# Patient Record
Sex: Female | Born: 1995 | Race: White | Hispanic: No | Smoking: Current some day smoker
Health system: Southern US, Community
[De-identification: ages and names within clinical notes are randomized; demographics above are authoritative.]

## PROBLEM LIST (undated history)

## (undated) DIAGNOSIS — F329 Major depressive disorder, single episode, unspecified: Secondary | ICD-10-CM

## (undated) DIAGNOSIS — F909 Attention-deficit hyperactivity disorder, unspecified type: Secondary | ICD-10-CM

## (undated) DIAGNOSIS — Z803 Family history of malignant neoplasm of breast: Secondary | ICD-10-CM

## (undated) DIAGNOSIS — F32A Depression, unspecified: Secondary | ICD-10-CM

## (undated) HISTORY — DX: Family history of malignant neoplasm of breast: Z80.3

## (undated) HISTORY — PX: WISDOM TOOTH EXTRACTION: SHX21

---
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
## 2014-09-28 ENCOUNTER — Encounter: Payer: Self-pay | Admitting: Emergency Medicine

## 2014-09-28 ENCOUNTER — Emergency Department
Admission: EM | Admit: 2014-09-28 | Discharge: 2014-09-28 | Disposition: A | Discharge status: Home / Self Care | Payer: BLUE CROSS/BLUE SHIELD | Attending: Emergency Medicine | Admitting: Emergency Medicine

## 2014-09-28 DIAGNOSIS — Z87891 Personal history of nicotine dependence: Secondary | ICD-10-CM | POA: Insufficient documentation

## 2014-09-28 DIAGNOSIS — F32A Depression, unspecified: Secondary | ICD-10-CM

## 2014-09-28 DIAGNOSIS — F329 Major depressive disorder, single episode, unspecified: Secondary | ICD-10-CM | POA: Insufficient documentation

## 2014-09-28 DIAGNOSIS — Z79899 Other long term (current) drug therapy: Secondary | ICD-10-CM | POA: Diagnosis not present

## 2014-09-28 DIAGNOSIS — F418 Other specified anxiety disorders: Secondary | ICD-10-CM

## 2014-09-28 DIAGNOSIS — Z3202 Encounter for pregnancy test, result negative: Secondary | ICD-10-CM | POA: Insufficient documentation

## 2014-09-28 DIAGNOSIS — F909 Attention-deficit hyperactivity disorder, unspecified type: Secondary | ICD-10-CM | POA: Diagnosis not present

## 2014-09-28 DIAGNOSIS — F430 Acute stress reaction: Secondary | ICD-10-CM | POA: Insufficient documentation

## 2014-09-28 DIAGNOSIS — F439 Reaction to severe stress, unspecified: Secondary | ICD-10-CM

## 2014-09-28 HISTORY — DX: Attention-deficit hyperactivity disorder, unspecified type: F90.9

## 2014-09-28 HISTORY — DX: Depression, unspecified: F32.A

## 2014-09-28 HISTORY — DX: Other specified anxiety disorders: F41.8

## 2014-09-28 HISTORY — DX: Major depressive disorder, single episode, unspecified: F32.9

## 2014-09-28 LAB — COMPREHENSIVE METABOLIC PANEL
ALT: 15 U/L (ref 14–54)
AST: 22 U/L (ref 15–41)
Albumin: 4.9 g/dL (ref 3.5–5.0)
Alkaline Phosphatase: 51 U/L (ref 38–126)
Anion gap: 6 (ref 5–15)
BUN: 15 mg/dL (ref 6–20)
CO2: 26 mmol/L (ref 22–32)
Calcium: 9 mg/dL (ref 8.9–10.3)
Chloride: 105 mmol/L (ref 101–111)
Creatinine, Ser: 0.98 mg/dL (ref 0.44–1.00)
GFR calc Af Amer: >60 mL/min (ref >60)
GFR calc non Af Amer: >60 mL/min (ref >60)
Glucose, Bld: 99 mg/dL (ref 65–99)
Potassium: 4.1 mmol/L (ref 3.5–5.1)
Sodium: 137 mmol/L (ref 135–145)
Total Bilirubin: 0.4 mg/dL (ref 0.3–1.2)
Total Protein: 7.5 g/dL (ref 6.5–8.1)

## 2014-09-28 LAB — POCT PREGNANCY, URINE: Preg Test, Ur: NEGATIVE

## 2014-09-28 LAB — URINE DRUG SCREEN, QUALITATIVE (ARMC ONLY)
Amphetamines, Ur Screen: NOT DETECTED — AB
Barbiturates, Ur Screen: NOT DETECTED — AB
Benzodiazepine, Ur Scrn: NOT DETECTED — AB
Cannabinoid 50 Ng, Ur ~~LOC~~: POSITIVE — AB
Cocaine Metabolite,Ur ~~LOC~~: POSITIVE — AB
MDMA (Ecstasy)Ur Screen: NOT DETECTED — AB
Methadone Scn, Ur: NOT DETECTED — AB
Opiate, Ur Screen: NOT DETECTED — AB
Phencyclidine (PCP) Ur S: NOT DETECTED — AB
Tricyclic, Ur Screen: NOT DETECTED — AB

## 2014-09-28 LAB — CBC
HCT: 40.8 % (ref 35.0–47.0)
Hemoglobin: 13.5 g/dL (ref 12.0–16.0)
MCH: 27.7 pg (ref 26.0–34.0)
MCHC: 33.2 g/dL (ref 32.0–36.0)
MCV: 83.4 fL (ref 80.0–100.0)
Platelets: 304 10*3/uL (ref 150–440)
RBC: 4.89 MIL/uL (ref 3.80–5.20)
RDW: 13.7 % (ref 11.5–14.5)
WBC: 11.4 10*3/uL — ABNORMAL HIGH (ref 3.6–11.0)

## 2014-09-28 LAB — SALICYLATE LEVEL: Salicylate Lvl: <4 mg/dL (ref 2.8–30.0)

## 2014-09-28 LAB — ACETAMINOPHEN LEVEL: Acetaminophen (Tylenol), Serum: <10 ug/mL — ABNORMAL LOW (ref 10–30)

## 2014-09-28 LAB — ETHANOL: Alcohol, Ethyl (B): <5 mg/dL (ref <5)

## 2014-09-28 MED ORDER — IBUPROFEN 600 MG PO TABS
ORAL_TABLET | ORAL | Status: AC
  Administered 2014-09-28: 600 mg via ORAL
  Filled 2014-09-28: qty 1

## 2014-09-28 MED ORDER — IBUPROFEN 600 MG PO TABS
600.0000 mg | ORAL_TABLET | Freq: Once | ORAL | Status: AC
  Administered 2014-09-28: 600 mg via ORAL

## 2014-09-28 NOTE — ED Notes (Signed)
BEHAVIORAL HEALTH ROUNDING Patient sleeping: No. Patient alert and oriented: yes Behavior appropriate: Yes.  ; If no, describe:  Nutrition and fluids offered: Yes  Toileting and hygiene offered: Yes  Sitter present: no Law enforcement present: Yes  

## 2014-09-28 NOTE — ED Notes (Signed)
States has had depression x 5 years, states was charged for ticket today and felt more depressed, states has thoughts of how she would hurt herself but not act on those thoughts

## 2014-09-28 NOTE — ED Notes (Signed)
Pt for discharge. Pt called for ride.

## 2014-09-28 NOTE — ED Notes (Signed)
Pregnancy test negative

## 2014-09-28 NOTE — Consult Note (Signed)
[facility] Consult   Reason for Consult:  Disposition Referring Physician:  ER Patient Identification: Debbie Coleman MRN:  [id number] Principal Diagnosis: Depression with anxiety Diagnosis:   Patient Active Problem List   Diagnosis Date Noted  . Depression with anxiety [F41.8] 09/28/2014    Total Time spent with patient: 45 minutes  Subjective:   Debbie Coleman is a 19 y.o. female patient admitted with anxiety and depression about recent speeding ticket and did not keep her appt for class to get out of the sam as she over slept. HPI:  Has been living with [place] parents and working as a Educational psychologist and has no money to pay for speeding ticket and started getting upset and banging her head and family got concerned and called for help. HPI Elements:   Pt is being followed by Dr,Su at [facility] and last apt was on 09/03/2014 and next apt is on 10/03/2014 and is being followed for ADHD and  Is on Vyvance 30 mgs po q am.  Past Medical History:  Past Medical History  Diagnosis Date  . Depression   . ADHD (attention deficit hyperactivity disorder)    History reviewed. No pertinent past surgical history. Family History: History reviewed. No pertinent family history. Social History:  History  Alcohol Use No     History  Drug Use Not on file    History   Social History  . Marital Status: Single    Spouse Name: N/A  . Number of Children: N/A  . Years of Education: N/A   Social History Main Topics  . Smoking status: Former Research scientist (life sciences)  . Smokeless tobacco: Not on file  . Alcohol Use: No  . Drug Use: Not on file  . Sexual Activity: Not on file   Other Topics Concern  . None   Social History Narrative  . None   Additional Social History:    Pain Medications: None reported Prescriptions: None reported Over the Counter: None reported History of alcohol / drug use?: Yes Longest period of sobriety (when/how long): Unknown Negative Consequences of Use:  Financial, Legal, Personal relationships, Work / School Withdrawal Symptoms:  (None reported) Name of Substance 1: THC 1 - Age of First Use: 16 1 - Amount (size/oz): 1 gram 1 - Frequency: 3 to 4 days week 1 - Duration: 3 years 1 - Last Use / Amount: 09/27/2014                   Allergies:  No Known Allergies  Labs:  Results for orders placed or performed during the hospital encounter of 09/28/14 (from the past 48 hour(s))  CBC     Status: Abnormal   Collection Time: 09/28/14 12:25 PM  Result Value Ref Range   WBC 11.4 (H) 3.6 - 11.0 K/uL   RBC 4.89 3.80 - 5.20 MIL/uL   Hemoglobin 13.5 12.0 - 16.0 g/dL   HCT 40.8 35.0 - 47.0 %   MCV 83.4 80.0 - 100.0 fL   MCH 27.7 26.0 - 34.0 pg   MCHC 33.2 32.0 - 36.0 g/dL   RDW 13.7 11.5 - 14.5 %   Platelets 304 150 - 440 K/uL  Comprehensive metabolic panel     Status: None   Collection Time: 09/28/14 12:25 PM  Result Value Ref Range   Sodium 137 135 - 145 mmol/L   Potassium 4.1 3.5 - 5.1 mmol/L   Chloride 105 101 - 111 mmol/L   CO2 26 22 - 32 mmol/L  Glucose, Bld 99 65 - 99 mg/dL   BUN 15 6 - 20 mg/dL   Creatinine, Ser 0.98 0.44 - 1.00 mg/dL   Calcium 9.0 8.9 - 10.3 mg/dL   Total Protein 7.5 6.5 - 8.1 g/dL   Albumin 4.9 3.5 - 5.0 g/dL   AST 22 15 - 41 U/L   ALT 15 14 - 54 U/L   Alkaline Phosphatase 51 38 - 126 U/L   Total Bilirubin 0.4 0.3 - 1.2 mg/dL   GFR calc non Af Amer >60 >60 mL/min   GFR calc Af Amer >60 >60 mL/min    Comment: (NOTE) The eGFR has been calculated using the CKD EPI equation. This calculation has not been validated in all clinical situations. eGFR's persistently <60 mL/min signify possible Chronic Kidney Disease.    Anion gap 6 5 - 15  Ethanol (ETOH)     Status: None   Collection Time: 09/28/14 12:25 PM  Result Value Ref Range   Alcohol, Ethyl (B) <5 <5 mg/dL    Comment:        LOWEST DETECTABLE LIMIT FOR SERUM ALCOHOL IS 11 mg/dL FOR MEDICAL PURPOSES ONLY   Acetaminophen level     Status:  Abnormal   Collection Time: 09/28/14 12:25 PM  Result Value Ref Range   Acetaminophen (Tylenol), Serum <10 (L) 10 - 30 ug/mL    Comment:        THERAPEUTIC CONCENTRATIONS VARY SIGNIFICANTLY. A RANGE OF 10-30 ug/mL MAY BE AN EFFECTIVE CONCENTRATION FOR MANY PATIENTS. HOWEVER, SOME ARE BEST TREATED AT CONCENTRATIONS OUTSIDE THIS RANGE. ACETAMINOPHEN CONCENTRATIONS >150 ug/mL AT 4 HOURS AFTER INGESTION AND >50 ug/mL AT 12 HOURS AFTER INGESTION ARE OFTEN ASSOCIATED WITH TOXIC REACTIONS.   Salicylate level     Status: None   Collection Time: 09/28/14 12:25 PM  Result Value Ref Range   Salicylate Lvl <3.9 2.8 - 30.0 mg/dL  Pregnancy, urine POC     Status: None   Collection Time: 09/28/14 12:57 PM  Result Value Ref Range   Preg Test, Ur NEGATIVE NEGATIVE    Comment:        THE SENSITIVITY OF THIS METHODOLOGY IS >24 mIU/mL     Vitals: Blood pressure 103/58, pulse 69, temperature 98.4 F (36.9 C), temperature source Oral, resp. rate 18, height '5\' 6"'  (1.676 m), weight 52.164 kg (115 lb), last menstrual period 09/28/2014, SpO2 100 %.  Risk to Self: Suicidal Ideation: No Suicidal Intent: No Is patient at risk for suicide?: No Suicidal Plan?: No Access to Means: No What has been your use of drugs/alcohol within the last 12 months?: THC use How many times?: 0 Other Self Harm Risks: None reported Triggers for Past Attempts: Other (Comment) (Stress) Intentional Self Injurious Behavior:  ("Banging her head against wall.") Risk to Others: Homicidal Ideation: No Thoughts of Harm to Others: No Current Homicidal Intent: No Current Homicidal Plan: No Access to Homicidal Means: No Identified Victim: None Reported History of harm to others?: No Assessment of Violence: None Noted Violent Behavior Description: None reported Does patient have access to weapons?: No Criminal Charges Pending?: No Does patient have a court date: No Prior Inpatient Therapy: Prior Inpatient Therapy:  No Prior Therapy Dates: None reported Prior Therapy Facilty/Provider(s): None reported Reason for Treatment: n/a Prior Outpatient Therapy: Prior Outpatient Therapy: Yes Prior Therapy Facilty/Provider(s): [facility] Reason for Treatment: ADHD Does patient have an ACCT team?: No Does patient have Intensive In-House Services?  : No Does patient have [facility] services? : No Does patient  have [facility] services?: No  No current facility-administered medications for this encounter.   Current Outpatient Prescriptions  Medication Sig Dispense Refill  . lisdexamfetamine (VYVANSE) 30 MG capsule Take 30 mg by mouth every other day.      Musculoskeletal: Strength & Muscle Tone: within normal limits Gait & Station: normal Patient leans: N/A  Psychiatric Specialty Exam: Physical Exam  Review of Systems  Constitutional: Negative.   HENT: Negative.   Eyes: Negative.   Respiratory: Negative.   Cardiovascular: Negative.   Gastrointestinal: Negative.   Genitourinary: Negative.   Skin: Negative.   Neurological: Negative.   Endo/Heme/Allergies: Negative.   Psychiatric/Behavioral: Negative.     Blood pressure 103/58, pulse 69, temperature 98.4 F (36.9 C), temperature source Oral, resp. rate 18, height '5\' 6"'  (1.676 m), weight 52.164 kg (115 lb), last menstrual period 09/28/2014, SpO2 100 %.Body mass index is 18.57 kg/(m^2).  General Appearance: Casual  Eye Contact::  Fair  Speech:  Clear and Coherent  Volume:  Normal  Mood:  Anxious  Affect:  Constricted  Thought Process:  Negative  Orientation:  Full (Time, Place, and Person)  Thought Content:  Negative  Suicidal Thoughts:  No  Homicidal Thoughts:  No  Memory:  Remote;   Good  Judgement:  Good  Insight:  Good  Psychomotor Activity:  Negative  Concentration:  Fair  Recall:  Good  Fund of Knowledge:Fair  Language: Fair  Akathisia:  No  Handed:  Right  AIMS (if indicated):     Assets:  Communication Skills Desire for  Improvement Housing Social Support  ADL's:  Intact  Cognition: WNL  Sleep:      Medical Decision Making: Review of Psycho-Social Stressors (1)  Treatment Plan Summary: Plan D/C IVC and discharge pt home to get help with Speeding ticket and keep follow up apt at [facility] for her worry and depression  about the same  Plan:  No evidence of imminent risk to self or others at present.   Patient does not meet criteria for psychiatric inpatient admission. Supportive therapy provided about ongoing stressors. D/C IVC and pt will go home and get help for her problems Disposition: As above  Dewain Penning 09/28/2014 6:33 PM

## 2014-09-28 NOTE — ED Notes (Signed)

## 2014-09-28 NOTE — ED Provider Notes (Signed)
Patient is cleared for discharge by Dr. Dorene Grebehawla. IVC is rescinded.  Patient given instructions by myself and psychiatry to follow up with her provider at [facility] as well as return precautions. Patient is currently stable. Vital signs normal. Awake and fully alert.  Sharyn CreamerMark Otto Caraway, MD 09/28/14 [phone]

## 2014-09-28 NOTE — ED Notes (Signed)
BEHAVIORAL HEALTH ROUNDING  Patient sleeping: No.  Patient alert and oriented: yes  Behavior appropriate: Yes. ; If no, describe:  Nutrition and fluids offered: Yes  Toileting and hygiene offered: Yes  Sitter present: not applicable  Law enforcement present: Yes ODS  

## 2014-09-28 NOTE — Discharge Instructions (Signed)
Depression  Follow-up with [facility] within the next 3-4 days. Return to the ER right away should you feel her risk of injury yourself, others, he developed hallucinations, failure symptoms are worsening, or other new concerns arise.  Depression refers to feeling sad, low, down in the dumps, blue, gloomy, or empty. In general, there are two kinds of depression: 1. Normal sadness or normal grief. This kind of depression is one that we all feel from time to time after upsetting life experiences, such as the loss of a job or the ending of a relationship. This kind of depression is considered normal, is short lived, and resolves within a few days to 2 weeks. Depression experienced after the loss of a loved one (bereavement) often lasts longer than 2 weeks but normally gets better with time. 2. Clinical depression. This kind of depression lasts longer than normal sadness or normal grief or interferes with your ability to function at home, at work, and in school. It also interferes with your personal relationships. It affects almost every aspect of your life. Clinical depression is an illness. Symptoms of depression can also be caused by conditions other than those mentioned above, such as:  Physical illness. Some physical illnesses, including underactive thyroid gland (hypothyroidism), severe anemia, specific types of cancer, diabetes, uncontrolled seizures, heart and lung problems, strokes, and chronic pain are commonly associated with symptoms of depression.  Side effects of some prescription medicine. In some people, certain types of medicine can cause symptoms of depression.  Substance abuse. Abuse of alcohol and illicit drugs can cause symptoms of depression. SYMPTOMS Symptoms of normal sadness and normal grief include the following:  Feeling sad or crying for short periods of time.  Not caring about anything (apathy).  Difficulty sleeping or sleeping too much.  No longer able  to enjoy the things you used to enjoy.  Desire to be by oneself all the time (social isolation).  Lack of energy or motivation.  Difficulty concentrating or remembering.  Change in appetite or weight.  Restlessness or agitation. Symptoms of clinical depression include the same symptoms of normal sadness or normal grief and also the following symptoms:  Feeling sad or crying all the time.  Feelings of guilt or worthlessness.  Feelings of hopelessness or helplessness.  Thoughts of suicide or the desire to harm yourself (suicidal ideation).  Loss of touch with reality (psychotic symptoms). Seeing or hearing things that are not real (hallucinations) or having false beliefs about your life or the people around you (delusions and paranoia). DIAGNOSIS  The diagnosis of clinical depression is usually based on how bad the symptoms are and how long they have lasted. Your health care provider will also ask you questions about your medical history and substance use to find out if physical illness, use of prescription medicine, or substance abuse is causing your depression. Your health care provider may also order blood tests. TREATMENT  Often, normal sadness and normal grief do not require treatment. However, sometimes antidepressant medicine is given for bereavement to ease the depressive symptoms until they resolve. The treatment for clinical depression depends on how bad the symptoms are but often includes antidepressant medicine, counseling with a mental health professional, or both. Your health care provider will help to determine what treatment is best for you. Depression caused by physical illness usually goes away with appropriate medical treatment of the illness. If prescription medicine is causing depression, talk with your health care provider about stopping the medicine, decreasing the dose,  or changing to another medicine. Depression caused by the abuse of alcohol or illicit drugs goes  away when you stop using these substances. Some adults need professional help in order to stop drinking or using drugs. SEEK IMMEDIATE MEDICAL CARE IF:  You have thoughts about hurting yourself or others.  You lose touch with reality (have psychotic symptoms).  You are taking medicine for depression and have a serious side effect. FOR MORE INFORMATION  National Alliance on Mental Illness: www.nami.[employer]  National Institute of Mental Health: http://www.maynard.net/www.nimh.nih.gov Document Released: 04/30/2000 Document Revised: 09/17/2013 Document Reviewed: 08/02/2011 [facility] Patient Information 2015 [place], [place]. This information is not intended to replace advice given to you by your health care provider. Make sure you discuss any questions you have with your health care provider.

## 2014-09-28 NOTE — BH Assessment (Signed)
Assessment Note  Debbie Coleman is an 19 y.o. female Who presents to the ER due to increase symptoms of depression and ADHD. Pt. states, several things have taken place and today she feels like they have overwhelmed. She was supposed to go to a driving class this morning, that would help her with a recent speeding ticket.  However, she overslept and she was unable to attend. According to the pt., she had to get the money from her grandmother to pay for the class and is unable to pay her back. She further reports, her car payment is due and she doesn't have enough money to pay that. Thus, when she gets overwhelmed she began to have an increase of racing thoughts and majority of them are negative. She states, she has thoughts of hurting herself but will not follow through with them. She explains, they are thoughts of stabbing herself in the chest with a knife to get rid of the pain. They are thoughts that "flow in an out. It's not like I sit and think about. I be like, why the fuck would I do that."  She reports she has never acted on the thoughts. She has a history of cutting. This is when she was in middle school. Since then she hasn't cut. She currently "bangs her head on the wall." This is done when she gets overwhelmed or stressed out. Today is the last time this has happened.  Pt. states, she doesn't do it on a regular basis nor does she does it to the point of hurting herself.  She is currently being followed by Psych MD, Dr. Fannie Knee with [facility]. She was last seen by him 09/03/2014 and she sees him on a monthly basis.  Pt. denies AV/H. She also denies past Suicidal/Homicidal Ideations, Gestures or attempts.  Axis I: ADHD, hyperactive type and Anxiety Disorder NOS Axis III:  Past Medical History  Diagnosis Date  . Depression   . ADHD (attention deficit hyperactivity disorder)    Axis IV: economic problems, occupational problems, other psychosocial or environmental problems, problems related to  legal system/crime, problems related to social environment and problems with primary support group  Past Medical History:  Past Medical History  Diagnosis Date  . Depression   . ADHD (attention deficit hyperactivity disorder)     No past surgical history on file.  Family History: No family history on file.  Social History:  reports that she has quit smoking. She does not have any smokeless tobacco history on file. She reports that she does not drink alcohol. Her drug history is not on file.  Additional Social History:  Alcohol / Drug Use Pain Medications: None reported Prescriptions: None reported Over the Counter: None reported History of alcohol / drug use?: Yes Longest period of sobriety (when/how long): Unknown Negative Consequences of Use: Financial, Legal, Personal relationships, Work / School Withdrawal Symptoms:  (None reported) Substance #1 Name of Substance 1: THC 1 - Age of First Use: 16 1 - Amount (size/oz): 1 gram 1 - Frequency: 3 to 4 days week 1 - Duration: 3 years 1 - Last Use / Amount: 09/27/2014  CIWA: CIWA-Ar BP: (!) 103/58 mmHg Pulse Rate: 69 COWS:    Allergies: No Known Allergies  Home Medications:  (Not in a hospital admission)  OB/GYN Status:  Patient's last menstrual period was 09/28/2014.  General Assessment Data Location of Assessment: [facility] ED Is this a Tele or Face-to-Face Assessment?: Face-to-Face Is this an Initial Assessment or a Re-assessment for  this encounter?: Initial Assessment Marital status: Single Is patient pregnant?: No Pregnancy Status: No Living Arrangements: Other relatives (Grandmother) Can pt return to current living arrangement?: Yes Admission Status: Voluntary Is patient capable of signing voluntary admission?: Yes Referral Source: Self/Family/Friend Insurance type: None  Medical Screening Exam [facility] Walk-in ONLY) Medical Exam completed: Yes  Crisis Care Plan Living Arrangements: Other relatives  Database administrator(Grandmother) Name of Psychiatrist: Dr. Janeece RiggersSu  Education Status Is patient currently in school?: No Highest grade of school patient has completed: 12  Risk to self with the past 6 months Suicidal Ideation: No Has patient been a risk to self within the past 6 months prior to admission? : No Suicidal Intent: No Has patient had any suicidal intent within the past 6 months prior to admission? : No Is patient at risk for suicide?: No Suicidal Plan?: No Has patient had any suicidal plan within the past 6 months prior to admission? : No Access to Means: No What has been your use of drugs/alcohol within the last 12 months?: THC use Previous Attempts/Gestures: No How many times?: 0 Other Self Harm Risks: None reported Triggers for Past Attempts: Other (Comment) (Stress) Intentional Self Injurious Behavior:  ("Banging her head against wall.") Family Suicide History: No Recent stressful life event(s): Conflict (Comment), Financial Problems, Legal Issues Persecutory voices/beliefs?: No Depression: Yes Depression Symptoms: Guilt, Feeling angry/irritable, Feeling worthless/self pity Substance abuse history and/or treatment for substance abuse?: Yes Suicide prevention information given to non-admitted patients: Not applicable  Risk to Others within the past 6 months Homicidal Ideation: No Does patient have any lifetime risk of violence toward others beyond the six months prior to admission? : No Thoughts of Harm to Others: No Current Homicidal Intent: No Current Homicidal Plan: No Access to Homicidal Means: No Identified Victim: None Reported History of harm to others?: No Assessment of Violence: None Noted Violent Behavior Description: None reported Does patient have access to weapons?: No Criminal Charges Pending?: No Does patient have a court date: No Is patient on probation?: No  Psychosis Hallucinations: None noted Delusions: None noted  Mental Status Report Appearance/Hygiene:  Unremarkable, In hospital gown Eye Contact: Good Motor Activity: Unremarkable, Freedom of movement Speech: Logical/coherent Level of Consciousness: Alert Mood: Depressed, Anxious, Sad Affect: Sad, Depressed, Appropriate to circumstance Anxiety Level: Minimal Thought Processes: Coherent, Relevant Judgement: Impaired Orientation: Person, Place, Time, Situation, Appropriate for developmental age Obsessive Compulsive Thoughts/Behaviors: Minimal  Cognitive Functioning Concentration: Decreased Memory: Remote Intact, Recent Intact IQ: Average Insight: Fair Impulse Control: Fair Appetite: Fair Weight Loss: 0 Weight Gain: 0 Sleep: Decreased Total Hours of Sleep: 5 Vegetative Symptoms: None  ADLScreening [facility] Assessment Services) Patient's cognitive ability adequate to safely complete daily activities?: Yes Patient able to express need for assistance with ADLs?: Yes Independently performs ADLs?: Yes (appropriate for developmental age)  Prior Inpatient Therapy Prior Inpatient Therapy: No Prior Therapy Dates: None reported Prior Therapy Facilty/Provider(s): None reported Reason for Treatment: n/a  Prior Outpatient Therapy Prior Outpatient Therapy: Yes Prior Therapy Facilty/Provider(s): [facility] Reason for Treatment: ADHD Does patient have an ACCT team?: No Does patient have Intensive In-House Services?  : No Does patient have [facility] services? : No Does patient have P4CC services?: No  ADL Screening (condition at time of admission) Patient's cognitive ability adequate to safely complete daily activities?: Yes Patient able to express need for assistance with ADLs?: Yes Independently performs ADLs?: Yes (appropriate for developmental age)       Abuse/Neglect Assessment (Assessment to be complete while patient is alone) Physical  Abuse: Denies Verbal Abuse: Denies Sexual Abuse: Denies Exploitation of patient/patient's resources: Denies Self-Neglect: Denies Values /  Beliefs Cultural Requests During Hospitalization: None Spiritual Requests During Hospitalization: None Consults Spiritual Care Consult Needed: No Social Work Consult Needed: No Merchant navy officerAdvance Directives (For Healthcare) Does patient have an advance directive?: No Would patient like information on creating an advanced directive?: No - patient declined information    Additional Information 1:1 In Past 12 Months?: No CIRT Risk: No Elopement Risk: No Does patient have medical clearance?: Yes  Child/Adolescent Assessment Running Away Risk: Admits (Pt. is an adult)  Disposition:  Disposition Initial Assessment Completed for this Encounter: Yes Disposition of Patient: Other dispositions (Psych MD to see) Other disposition(s): Other (Comment) (Psych MD to see)  On Site Evaluation by:   Reviewed with Physician:    Lilyan Gilfordalvin J. Giovanny Dugal, MS, LCAS, LPC, NCC, CCSI 09/28/2014 6:16 PM

## 2014-09-28 NOTE — ED Provider Notes (Signed)
[facility] Emergency Department Provider Note    ____________________________________________  Time seen: 1350  I have reviewed the triage vital signs and the nursing notes.   HISTORY  Chief Complaint Depression   History limited by: Not Limited   HPI Debbie Coleman is a 19 y.o. female who presents to the emergency department today because of concerns for depression. The patient states that she has felt depressed for the past roughly 5 years. She states she does have a psychiatrist but mainly talks to him about her ADHD. The patient states that her depression has been getting worse as she has been under stress. Today in particular she was under a lot of stress because she was missed her driving school which she was going to go 2 to get out of a speeding ticket. She states he overslept. She denies any current SI. States she has had leading thoughts wanting to harm herself in the past. States she would never actually harm herself.     Past Medical History  Diagnosis Date  . Depression   . ADHD (attention deficit hyperactivity disorder)     There are no active problems to display for this patient.   No past surgical history on file.  Current Outpatient Rx  Name  Route  Sig  Dispense  Refill  . lisdexamfetamine (VYVANSE) 30 MG capsule   Oral   Take 30 mg by mouth every other day.           Allergies Review of patient's allergies indicates no known allergies.  No family history on file.  Social History History  Substance Use Topics  . Smoking status: Former Games developermoker  . Smokeless tobacco: Not on file  . Alcohol Use: No    Review of Systems  Constitutional: Negative for fever. Cardiovascular: Negative for chest pain. Respiratory: Negative for shortness of breath. Gastrointestinal: Negative for abdominal pain, vomiting and diarrhea. Genitourinary: Negative for dysuria. Musculoskeletal: Negative for back pain. Skin: Negative for  rash. Neurological: Negative for headaches, focal weakness or numbness.   10-point ROS otherwise negative.  ____________________________________________   PHYSICAL EXAM:  VITAL SIGNS: ED Triage Vitals  Enc Vitals Group     BP 09/28/14 1236 103/58 mmHg     Pulse Rate 09/28/14 1236 69     Resp 09/28/14 1236 18     Temp 09/28/14 1236 98.4 F (36.9 C)     Temp Source 09/28/14 1236 Oral     SpO2 09/28/14 1236 100 %     Weight 09/28/14 1236 115 lb (52.164 kg)     Height 09/28/14 1236 5\' 6"  (1.676 m)     Head Cir --      Peak Flow --      Pain Score 09/28/14 1238 6     Pain Loc --      Pain Edu? --      Excl. in GC? --      Constitutional: Alert and oriented. Well appearing and in no distress. Eyes: Conjunctivae are normal. PERRL. Normal extraocular movements. ENT   Head: Normocephalic and atraumatic.   Nose: No congestion/rhinnorhea.   Mouth/Throat: Mucous membranes are moist.   Neck: No stridor. Hematological/Lymphatic/Immunilogical: No cervical lymphadenopathy. Cardiovascular: Normal rate, regular rhythm.  No murmurs, rubs, or gallops. Respiratory: Normal respiratory effort without tachypnea nor retractions. Breath sounds are clear and equal bilaterally. No wheezes/rales/rhonchi. Gastrointestinal: Soft and nontender. No distention.  Genitourinary: Deferred Musculoskeletal: Normal range of motion in all extremities. No joint effusions.  No lower  extremity tenderness nor edema. Neurologic:  Normal speech and language. No gross focal neurologic deficits are appreciated. Speech is normal.  Skin:  Skin is warm, dry and intact. No rash noted. Psychiatric: Mood and affect are normal. Speech and behavior are normal. Patient exhibits appropriate insight and judgment.  ____________________________________________    LABS (pertinent positives/negatives)  Awaiting ethanol, acetaminophen and salicylate at time of sign out. Otherwise blood work  unremarkable. ____________________________________________   EKG  None  ____________________________________________    RADIOLOGY  None  ____________________________________________   PROCEDURES  Procedure(s) performed: None  Critical Care performed: No  ____________________________________________   INITIAL IMPRESSION / ASSESSMENT AND PLAN / ED COURSE  Pertinent labs & imaging results that were available during my care of the patient were reviewed by me and considered in my medical decision making (see chart for details).  Patient here with acute worsening of depression. Denies any SI. No concerning findings on blood work. Will have behavioral health and psychiatry see patient.  ____________________________________________   FINAL CLINICAL IMPRESSION(S) / ED DIAGNOSES  Final diagnoses:  Depression  Stress     Phineas SemenGraydon Naijah Lacek, MD 09/28/14 1537

## 2014-10-22 ENCOUNTER — Other Ambulatory Visit: Payer: Self-pay | Admitting: Certified Nurse Midwife

## 2014-10-22 DIAGNOSIS — N63 Unspecified lump in unspecified breast: Secondary | ICD-10-CM

## 2014-10-24 ENCOUNTER — Ambulatory Visit
Admission: RE | Admit: 2014-10-24 | Discharge: 2014-10-24 | Disposition: A | Discharge status: Home / Self Care | Payer: BLUE CROSS/BLUE SHIELD | Source: Ambulatory Visit | Attending: Certified Nurse Midwife | Admitting: Certified Nurse Midwife

## 2014-10-24 DIAGNOSIS — N63 Unspecified lump in unspecified breast: Secondary | ICD-10-CM

## 2014-10-24 DIAGNOSIS — R928 Other abnormal and inconclusive findings on diagnostic imaging of breast: Secondary | ICD-10-CM | POA: Insufficient documentation

## 2016-03-02 DIAGNOSIS — M545 Low back pain, unspecified: Secondary | ICD-10-CM

## 2016-03-02 DIAGNOSIS — G8929 Other chronic pain: Secondary | ICD-10-CM | POA: Insufficient documentation

## 2016-03-02 HISTORY — DX: Low back pain, unspecified: M54.50

## 2016-08-02 ENCOUNTER — Ambulatory Visit: Payer: Self-pay

## 2016-08-04 ENCOUNTER — Ambulatory Visit (INDEPENDENT_AMBULATORY_CARE_PROVIDER_SITE_OTHER): Payer: BLUE CROSS/BLUE SHIELD

## 2016-08-04 ENCOUNTER — Ambulatory Visit: Payer: Self-pay

## 2016-08-04 DIAGNOSIS — Z308 Encounter for other contraceptive management: Secondary | ICD-10-CM | POA: Diagnosis not present

## 2016-08-04 LAB — POCT URINE PREGNANCY: Preg Test, Ur: NEGATIVE

## 2016-08-04 MED ORDER — MEDROXYPROGESTERONE ACETATE 150 MG/ML IM SUSP
150.0000 mg | Freq: Once | INTRAMUSCULAR | Status: AC
  Administered 2016-08-04: 150 mg via INTRAMUSCULAR

## 2016-10-27 ENCOUNTER — Ambulatory Visit: Payer: BLUE CROSS/BLUE SHIELD

## 2016-11-01 ENCOUNTER — Other Ambulatory Visit: Payer: Self-pay

## 2016-11-01 ENCOUNTER — Ambulatory Visit (INDEPENDENT_AMBULATORY_CARE_PROVIDER_SITE_OTHER): Payer: BLUE CROSS/BLUE SHIELD

## 2016-11-01 ENCOUNTER — Telehealth: Payer: Self-pay | Admitting: Obstetrics and Gynecology

## 2016-11-01 DIAGNOSIS — Z3042 Encounter for surveillance of injectable contraceptive: Secondary | ICD-10-CM

## 2016-11-01 MED ORDER — MEDROXYPROGESTERONE ACETATE 150 MG/ML IM SUSP
150.0000 mg | INTRAMUSCULAR | 0 refills | Status: DC
Start: 2016-11-01 — End: 2017-01-21

## 2016-11-01 MED ORDER — MEDROXYPROGESTERONE ACETATE 150 MG/ML IM SUSP
150.0000 mg | Freq: Once | INTRAMUSCULAR | Status: AC
  Administered 2016-11-01: 150 mg via INTRAMUSCULAR

## 2016-11-01 NOTE — Telephone Encounter (Signed)
Rx refill sent in, called pt no vmb set up, pt also needs to schedule annual

## 2016-11-01 NOTE — Telephone Encounter (Signed)
Pt missed her Depo shot appt that was scheduled for 10/27/16. Pt is scheduled for this afternoon at 4pm. Pt wanted to make sure her [facility] [address] [facility] [address] [facility] [address] so she can pick it up. Please advise. Thanks TNP

## 2016-11-01 NOTE — Telephone Encounter (Signed)
Pt returned call. Pt was advised as below and pt stated she would schedule her annual after her appt this afternoon. Thanks TNP

## 2017-01-21 ENCOUNTER — Other Ambulatory Visit: Payer: Self-pay | Admitting: Obstetrics & Gynecology

## 2017-01-21 DIAGNOSIS — Z3042 Encounter for surveillance of injectable contraceptive: Secondary | ICD-10-CM

## 2017-01-23 NOTE — Progress Notes (Deleted)
Gynecology Annual Exam  PCP: Kirk Ruths, MD  Chief Complaint: No chief complaint on file.   History of Present Illness:Debbie Coleman is a 21 year old Caucasian/White female , G 0 P 0 0 0 0 , who presents for her first annual gyn exam and Pap smear. . She is having no significant GYN problems.  Her menses are absent due to Depo provera. Her last dose was 10/24/2015.  She has had no spotting.   The patient's past medical history is notable for a history of ADD.   She is sexually active. She is currently using depo provera for contraception. Has had 3/3 Gardasil vaccinations.    There is a positive history of breast cancer in her maternal grandmother. Genetic testing has been done. The relative tested negative for BRCA 1 and negative for BRCA 2.  There is no family history of ovarian cancer.  The patient does do occasional self breast exams.  The patient does not smoke.  The patient does drink occasionally.  The patient does not use illegal drugs.  The patient does not exercise.  The patient may get adequate calcium in her diet.  She has not had a recent cholesterol screen and is not interested in labwork.    The patient {Blank single:19197::"reports","denies"} current symptoms of depression.    Review of Systems: Review of Systems  Constitutional: Negative for chills, fever and weight loss.  HENT: Negative for congestion, sinus pain and sore throat.   Eyes: Negative for blurred vision and pain.  Respiratory: Negative for hemoptysis, shortness of breath and wheezing.   Cardiovascular: Negative for chest pain, palpitations and leg swelling.  Gastrointestinal: Negative for abdominal pain, blood in stool, diarrhea, heartburn, nausea and vomiting.  Genitourinary: Negative for dysuria, frequency, hematuria and urgency.  Musculoskeletal: Negative for back pain, joint pain and myalgias.  Skin: Negative for itching and rash.  Neurological: Negative for dizziness,  tingling and headaches.  Endo/Heme/Allergies: Negative for environmental allergies and polydipsia. Does not bruise/bleed easily.       Negative for hirsutism   Psychiatric/Behavioral: Negative for depression. The patient is not nervous/anxious and does not have insomnia.     Past Medical History:  Past Medical History:  Diagnosis Date  . ADHD (attention deficit hyperactivity disorder)   . Depression     Past Surgical History:  No past surgical history on file.  Family History:  No family history on file.  Social History:  Social History   Social History  . Marital status: Single    Spouse name: N/A  . Number of children: N/A  . Years of education: N/A   Occupational History  . Not on file.   Social History Main Topics  . Smoking status: Former Research scientist (life sciences)  . Smokeless tobacco: Not on file  . Alcohol use No  . Drug use: Unknown  . Sexual activity: Not on file   Other Topics Concern  . Not on file   Social History Narrative  . No narrative on file    Allergies:  No Known Allergies  Medications: Prior to Admission medications   Medication Sig Start Date End Date Taking? Authorizing Provider  lisdexamfetamine (VYVANSE) 30 MG capsule Take 30 mg by mouth every other day.    [provider]  medroxyPROGESTERone (DEPO-PROVERA) 150 MG/ML injection INJECT 1 ML (150 MG TOTAL) INTO THE MUSCLE EVERY 3 (THREE) MONTHS. 01/21/17   Gae Dry, MD    Physical Exam Vitals: There were no vitals  taken for this visit.  General: NAD HEENT: normocephalic, anicteric Neck: no thyroid enlargement, no palpable nodules, no cervical lymphadenopathy  Pulmonary: No increased work of breathing, CTAB Cardiovascular: RRR, {Blank single:19197::"with murmur","without murmur"}  Breast: Breast symmetrical, no tenderness, no palpable nodules or masses, no skin or nipple retraction present, no nipple discharge.  No axillary, infraclavicular or supraclavicular lymphadenopathy. Abdomen:  Soft, non-tender, non-distended.  Umbilicus without lesions.  No hepatomegaly or masses palpable. No evidence of hernia. Genitourinary:  External: Normal external female genitalia.  Normal urethral meatus, normal  Bartholin's and Skene's glands.    Vagina: Normal vaginal mucosa, no evidence of prolapse.    Cervix: Grossly normal in appearance, no bleeding, non-tender  Uterus: Anteverted, normal size, shape, and consistency, mobile, and non-tender  Adnexa: No adnexal masses, non-tender  Rectal: deferred  Lymphatic: no evidence of inguinal lymphadenopathy Extremities: no edema, erythema, or tenderness Neurologic: Grossly intact Psychiatric: mood appropriate, affect full     Assessment: 21 y.o. No obstetric history on file. No problem-specific Assessment & Plan notes found for this encounter.   Plan:  ***  1) Breast cancer screening - recommend monthly self breast exam. {Blank single:19197::" ","Mammogram is up to date.","Mammogram was ordered today."}  2) STI screening was offered and {Blank single:19197::"accepted","declined"}.  3) Cervical cancer screening - {Blank single:19197::"Pap smear due in *** years","Pap not indicated","Pap was done"}. ASCCP guidelines and rational discussed.  Patient opts for {Blank single:19197::"every 5 years","every 3 years","yearly"} screening interval  4) Contraception - Education given regarding options for contraception  5) Routine healthcare maintenance including cholesterol and diabetes screening {Blank single:19197::"declined","managed by PCP","ordered today"}

## 2017-01-24 ENCOUNTER — Ambulatory Visit: Payer: BLUE CROSS/BLUE SHIELD | Admitting: Certified Nurse Midwife

## 2017-01-24 ENCOUNTER — Ambulatory Visit (INDEPENDENT_AMBULATORY_CARE_PROVIDER_SITE_OTHER): Payer: BLUE CROSS/BLUE SHIELD

## 2017-01-24 DIAGNOSIS — Z3042 Encounter for surveillance of injectable contraceptive: Secondary | ICD-10-CM | POA: Diagnosis not present

## 2017-01-24 MED ORDER — MEDROXYPROGESTERONE ACETATE 150 MG/ML IM SUSP
150.0000 mg | Freq: Once | INTRAMUSCULAR | Status: AC
  Administered 2017-01-24: 150 mg via INTRAMUSCULAR

## 2017-02-11 ENCOUNTER — Ambulatory Visit: Payer: BLUE CROSS/BLUE SHIELD | Admitting: Certified Nurse Midwife

## 2017-02-11 NOTE — Progress Notes (Deleted)
   Gynecology Annual Exam  PCP: Anderson, Marshall W, MD  Chief Complaint: No chief complaint on file.   History of Present Illness:Debbie Coleman is a 20 year old Caucasian/White female , G 0 P 0 0 0 0 , who presents for her first annual gyn exam and Pap smear. . She is having no significant GYN problems.  Her menses are absent due to Depo provera. Her last dose was 10/24/2015.  She has had no spotting.   The patient's past medical history is notable for a history of ADD.   She is sexually active. She is currently using depo provera for contraception. Has had 3/3 Gardasil vaccinations.    There is a positive history of breast cancer in her maternal grandmother. Genetic testing has been done. The relative tested negative for BRCA 1 and negative for BRCA 2.  There is no family history of ovarian cancer.  The patient does do occasional self breast exams.  The patient does not smoke.  The patient does drink occasionally.  The patient does not use illegal drugs.  The patient does not exercise.  The patient may get adequate calcium in her diet.  She has not had a recent cholesterol screen and is not interested in labwork.    The patient {Blank single:19197::"reports","denies"} current symptoms of depression.    Review of Systems: Review of Systems  Constitutional: Negative for chills, fever and weight loss.  HENT: Negative for congestion, sinus pain and sore throat.   Eyes: Negative for blurred vision and pain.  Respiratory: Negative for hemoptysis, shortness of breath and wheezing.   Cardiovascular: Negative for chest pain, palpitations and leg swelling.  Gastrointestinal: Negative for abdominal pain, blood in stool, diarrhea, heartburn, nausea and vomiting.  Genitourinary: Negative for dysuria, frequency, hematuria and urgency.  Musculoskeletal: Negative for back pain, joint pain and myalgias.  Skin: Negative for itching and rash.  Neurological: Negative for dizziness,  tingling and headaches.  Endo/Heme/Allergies: Negative for environmental allergies and polydipsia. Does not bruise/bleed easily.       Negative for hirsutism   Psychiatric/Behavioral: Negative for depression. The patient is not nervous/anxious and does not have insomnia.     Past Medical History:  Past Medical History:  Diagnosis Date  . ADHD (attention deficit hyperactivity disorder)   . Depression     Past Surgical History:  No past surgical history on file.  Family History:  No family history on file.  Social History:  Social History   Social History  . Marital status: Single    Spouse name: N/A  . Number of children: N/A  . Years of education: N/A   Occupational History  . Not on file.   Social History Main Topics  . Smoking status: Former Smoker  . Smokeless tobacco: Not on file  . Alcohol use No  . Drug use: Unknown  . Sexual activity: Not on file   Other Topics Concern  . Not on file   Social History Narrative  . No narrative on file    Allergies:  No Known Allergies  Medications: Prior to Admission medications   Medication Sig Start Date End Date Taking? Authorizing Provider  lisdexamfetamine (VYVANSE) 30 MG capsule Take 30 mg by mouth every other day.    [provider]  medroxyPROGESTERone (DEPO-PROVERA) 150 MG/ML injection INJECT 1 ML (150 MG TOTAL) INTO THE MUSCLE EVERY 3 (THREE) MONTHS. 01/21/17   Harris, Robert P, MD    Physical Exam Vitals: There were no vitals   taken for this visit.  General: NAD HEENT: normocephalic, anicteric Neck: no thyroid enlargement, no palpable nodules, no cervical lymphadenopathy  Pulmonary: No increased work of breathing, CTAB Cardiovascular: RRR, {Blank single:19197::"with murmur","without murmur"}  Breast: Breast symmetrical, no tenderness, no palpable nodules or masses, no skin or nipple retraction present, no nipple discharge.  No axillary, infraclavicular or supraclavicular lymphadenopathy. Abdomen:  Soft, non-tender, non-distended.  Umbilicus without lesions.  No hepatomegaly or masses palpable. No evidence of hernia. Genitourinary:  External: Normal external female genitalia.  Normal urethral meatus, normal  Bartholin's and Skene's glands.    Vagina: Normal vaginal mucosa, no evidence of prolapse.    Cervix: Grossly normal in appearance, no bleeding, non-tender  Uterus: Anteverted, normal size, shape, and consistency, mobile, and non-tender  Adnexa: No adnexal masses, non-tender  Rectal: deferred  Lymphatic: no evidence of inguinal lymphadenopathy Extremities: no edema, erythema, or tenderness Neurologic: Grossly intact Psychiatric: mood appropriate, affect full     Assessment: 21 y.o. No obstetric history on file. No problem-specific Assessment & Plan notes found for this encounter.   Plan:  ***  1) Breast cancer screening - recommend monthly self breast exam. {Blank single:19197::" ","Mammogram is up to date.","Mammogram was ordered today."}  2) STI screening was offered and {Blank single:19197::"accepted","declined"}.  3) Cervical cancer screening - {Blank single:19197::"Pap smear due in *** years","Pap not indicated","Pap was done"}. ASCCP guidelines and rational discussed.  Patient opts for {Blank single:19197::"every 5 years","every 3 years","yearly"} screening interval  4) Contraception - Education given regarding options for contraception  5) Routine healthcare maintenance including cholesterol and diabetes screening {Blank single:19197::"declined","managed by PCP","ordered today"}          

## 2017-04-15 ENCOUNTER — Other Ambulatory Visit: Payer: Self-pay | Admitting: Obstetrics & Gynecology

## 2017-04-15 DIAGNOSIS — Z3042 Encounter for surveillance of injectable contraceptive: Secondary | ICD-10-CM

## 2017-04-18 ENCOUNTER — Ambulatory Visit: Payer: BLUE CROSS/BLUE SHIELD

## 2017-04-28 ENCOUNTER — Ambulatory Visit: Payer: Self-pay

## 2017-05-05 ENCOUNTER — Ambulatory Visit (INDEPENDENT_AMBULATORY_CARE_PROVIDER_SITE_OTHER): Payer: Medicaid Other

## 2017-05-05 DIAGNOSIS — Z3042 Encounter for surveillance of injectable contraceptive: Secondary | ICD-10-CM | POA: Diagnosis not present

## 2017-05-05 DIAGNOSIS — Z308 Encounter for other contraceptive management: Secondary | ICD-10-CM

## 2017-05-05 DIAGNOSIS — Z309 Encounter for contraceptive management, unspecified: Secondary | ICD-10-CM | POA: Diagnosis not present

## 2017-05-05 LAB — POCT URINE PREGNANCY: Preg Test, Ur: NEGATIVE

## 2017-05-05 MED ORDER — MEDROXYPROGESTERONE ACETATE 150 MG/ML IM SUSP
150.0000 mg | Freq: Once | INTRAMUSCULAR | Status: AC
  Administered 2017-05-05: 150 mg via INTRAMUSCULAR

## 2017-05-05 NOTE — Progress Notes (Signed)
Pt here for depo which was given IM right glut after neg preg test.  NDC# [id number]

## 2017-07-28 ENCOUNTER — Ambulatory Visit (INDEPENDENT_AMBULATORY_CARE_PROVIDER_SITE_OTHER): Payer: Medicaid Other

## 2017-07-28 ENCOUNTER — Other Ambulatory Visit: Payer: Self-pay | Admitting: Certified Nurse Midwife

## 2017-07-28 ENCOUNTER — Ambulatory Visit: Payer: Medicaid Other

## 2017-07-28 ENCOUNTER — Other Ambulatory Visit: Payer: Self-pay | Admitting: Obstetrics & Gynecology

## 2017-07-28 DIAGNOSIS — Z3042 Encounter for surveillance of injectable contraceptive: Secondary | ICD-10-CM | POA: Diagnosis not present

## 2017-07-28 MED ORDER — MEDROXYPROGESTERONE ACETATE 150 MG/ML IM SUSP
150.0000 mg | Freq: Once | INTRAMUSCULAR | Status: AC
  Administered 2017-07-28: 150 mg via INTRAMUSCULAR

## 2017-07-28 NOTE — Telephone Encounter (Signed)
Pt is calling needing [facility] [place]

## 2017-07-28 NOTE — Telephone Encounter (Signed)
RX sent at 9:05 am by Dr. Tiburcio PeaHarris.

## 2017-08-15 DIAGNOSIS — Z803 Family history of malignant neoplasm of breast: Secondary | ICD-10-CM

## 2017-08-15 HISTORY — DX: Family history of malignant neoplasm of breast: Z80.3

## 2017-08-17 ENCOUNTER — Ambulatory Visit (INDEPENDENT_AMBULATORY_CARE_PROVIDER_SITE_OTHER): Payer: Medicaid Other | Admitting: Certified Nurse Midwife

## 2017-08-17 ENCOUNTER — Encounter: Payer: Self-pay | Admitting: Certified Nurse Midwife

## 2017-08-17 VITALS — BP 108/58 | HR 80 | Ht 65.0 in | Wt 126.0 lb

## 2017-08-17 DIAGNOSIS — Z309 Encounter for contraceptive management, unspecified: Secondary | ICD-10-CM

## 2017-08-17 DIAGNOSIS — Z113 Encounter for screening for infections with a predominantly sexual mode of transmission: Secondary | ICD-10-CM

## 2017-08-17 DIAGNOSIS — Z3042 Encounter for surveillance of injectable contraceptive: Secondary | ICD-10-CM

## 2017-08-17 DIAGNOSIS — Z124 Encounter for screening for malignant neoplasm of cervix: Secondary | ICD-10-CM

## 2017-08-17 DIAGNOSIS — Z01419 Encounter for gynecological examination (general) (routine) without abnormal findings: Secondary | ICD-10-CM

## 2017-08-17 MED ORDER — MEDROXYPROGESTERONE ACETATE 150 MG/ML IM SUSP: 150.0000 mg | INTRAMUSCULAR | 0 refills | Status: DC

## 2017-08-17 NOTE — Progress Notes (Signed)
Gynecology Annual Exam  PCP: Kirk Ruths, MD  Chief Complaint:  Chief Complaint  Patient presents with  . Gynecologic Exam    History of Present Illness:Debbie Coleman is a 22 year old Caucasian/White female , G 0 P 0 0 0 0 , who presents for her annual gyn exam . She is having no significant GYN problems.  Her menses are absent due to Depo provera. Her last dose was 07/28/2017 She has had no spotting.   The patient's past medical history is notable for a history of ADD.   She is sexually active. She is currently using depo provera for contraception. Has had 3/3 Gardasil vaccinations.    There is a positive history of breast cancer in her maternal grandmother. Genetic testing has been done. The relative tested negative for BRCA 1 and negative for BRCA 2.  There is no family history of ovarian cancer.  The patient does do occasional self breast exams.  The patient does not smoke.  The patient does drink 3-4 alcoholic beverages/week. The patient does not use illegal drugs.  The patient does occasional exercise.  The patient may get adequate calcium in her diet.  She has not had a recent cholesterol screen and is not interested in labwork.    Review of Systems: Review of Systems  Constitutional: Negative for chills, fever and weight loss.       Positive for weight gain  HENT: Negative for congestion, sinus pain and sore throat.   Eyes: Negative for blurred vision and pain.  Respiratory: Negative for hemoptysis, shortness of breath and wheezing.   Cardiovascular: Negative for chest pain, palpitations and leg swelling.  Gastrointestinal: Negative for abdominal pain, blood in stool, diarrhea, heartburn, nausea and vomiting.  Genitourinary: Negative for dysuria, frequency, hematuria and urgency.       Positive for amenorrhea  Musculoskeletal: Negative for back pain, joint pain and myalgias.  Skin: Negative for itching and rash.  Neurological: Negative for  dizziness, tingling and headaches.  Endo/Heme/Allergies: Negative for environmental allergies and polydipsia. Does not bruise/bleed easily.       Negative for hirsutism   Psychiatric/Behavioral: Negative for depression. The patient is not nervous/anxious and does not have insomnia.     Past Medical History:  Past Medical History:  Diagnosis Date  . ADHD (attention deficit hyperactivity disorder)   . Depression     Past Surgical History:  Past Surgical History:  Procedure Laterality Date  . FOOT SURGERY Left 09/2004   compound fracture of left foot-had open reduction and a second surgery to remove hardware.  . WISDOM TOOTH EXTRACTION  age 89    Family History:  Family History  Problem Relation Age of Onset  . Anxiety disorder Mother   . Other Maternal Grandmother        acquired absence of kidney  . Breast cancer Maternal Grandmother 86       BRCA negative not on tamoxifen after unknown estrogen  . Hypertension Maternal Grandmother   . Kidney failure Other     Social History:  Social History   Socioeconomic History  . Marital status: Single    Spouse name: Not on file  . Number of children: Not on file  . Years of education: Not on file  . Highest education level: Not on file  Occupational History  . Not on file  Social Needs  . Financial resource strain: Not on file  . Food insecurity:    Worry: Not  on file    Inability: Not on file  . Transportation needs:    Medical: Not on file    Non-medical: Not on file  Tobacco Use  . Smoking status: Former Research scientist (life sciences)  . Smokeless tobacco: Never Used  Substance and Sexual Activity  . Alcohol use: Yes    Comment: occasional  . Drug use: Never  . Sexual activity: Yes    Birth control/protection: Injection  Lifestyle  . Physical activity:    Days per week: 0 days    Minutes per session: 0 min  . Stress: To some extent  Relationships  . Social connections:    Talks on phone: Not on file    Gets together: Not on file     Attends religious service: Not on file    Active member of club or organization: Not on file    Attends meetings of clubs or organizations: Not on file    Relationship status: Not on file  . Intimate partner violence:    Fear of current or ex partner: Not on file    Emotionally abused: Not on file    Physically abused: Not on file    Forced sexual activity: Not on file  Other Topics Concern  . Not on file  Social History Narrative  . Not on file    Allergies:  No Known Allergies  Medications:  Current Outpatient Medications:  .  medroxyPROGESTERone (DEPO-PROVERA) 150 MG/ML injection, Inject 1 mL (150 mg total) into the muscle every 3 (three) months., Disp: 1 mL, Rfl: 0      Physical Exam Vitals: .BP (!) 108/58   Pulse 80   Ht '5\' 5"'  (1.651 m)   Wt 126 lb (57.2 kg)   BMI 20.97 kg/m   General: WF in NAD HEENT: normocephalic, anicteric Neck: no thyroid enlargement, no palpable nodules, no cervical lymphadenopathy  Pulmonary: No increased work of breathing, CTAB Cardiovascular: RRR, without murmur  Breast: Breast symmetrical, no tenderness, no palpable nodules or masses, no skin or nipple retraction present, no nipple discharge.  No axillary, infraclavicular or supraclavicular lymphadenopathy. Abdomen: Soft, non-tender, non-distended.  Umbilicus without lesions.  No hepatomegaly or masses palpable. No evidence of hernia. Genitourinary:  External: Normal external female genitalia.  Normal urethral meatus, normal  Bartholin's and Skene's glands.    Vagina: Normal vaginal mucosa, no evidence of prolapse.    Cervix: Grossly normal in appearance, no bleeding, non-tender  Uterus: Anteverted, deviated to left, normal size, shape, and consistency, mobile, and non-tender  Adnexa: No adnexal masses, non-tender  Rectal: deferred  Lymphatic: no evidence of inguinal lymphadenopathy Extremities: no edema, erythema, or tenderness Neurologic: Grossly intact Psychiatric: mood  appropriate, affect full     Assessment: 22 y.o. annual gyn exam  Plan:  1) Breast cancer screening - recommend monthly self breast exam.   2) STI screening was offered and accepted.  3) Cervical cancer screening -Paptima done  4) Contraception -No problems on the Depo. Would like to continue. RX: Depo Provera #1/RF x 4. Next dose due 10/20/2017  5) RTO 1 year for Pap smear/ annual exam  Dalia Heading, CNM

## 2017-08-19 LAB — PAP IG, CT-NG, RFX HPV ALL
Chlamydia, Nuc. Acid Amp: NEGATIVE
Gonococcus by Nucleic Acid Amp: NEGATIVE
PAP Smear Comment: 0

## 2017-09-13 ENCOUNTER — Encounter: Payer: Self-pay | Admitting: Obstetrics and Gynecology

## 2017-10-20 ENCOUNTER — Ambulatory Visit (INDEPENDENT_AMBULATORY_CARE_PROVIDER_SITE_OTHER): Payer: Self-pay

## 2017-10-20 DIAGNOSIS — Z308 Encounter for other contraceptive management: Secondary | ICD-10-CM

## 2017-10-20 MED ORDER — MEDROXYPROGESTERONE ACETATE 150 MG/ML IM SUSP
150.0000 mg | Freq: Once | INTRAMUSCULAR | Status: AC
Start: 2017-10-20 — End: 2017-10-20
  Administered 2017-10-20: 150 mg via INTRAMUSCULAR

## 2017-10-20 NOTE — Progress Notes (Signed)
Pt here for depo which was given IM right glut.  NDC# [id number] 

## 2018-01-12 ENCOUNTER — Ambulatory Visit: Payer: Medicaid Other

## 2018-01-12 ENCOUNTER — Ambulatory Visit (INDEPENDENT_AMBULATORY_CARE_PROVIDER_SITE_OTHER): Payer: Medicaid Other

## 2018-01-12 ENCOUNTER — Other Ambulatory Visit: Payer: Self-pay | Admitting: Obstetrics & Gynecology

## 2018-01-12 ENCOUNTER — Other Ambulatory Visit: Payer: Self-pay

## 2018-01-12 DIAGNOSIS — Z3042 Encounter for surveillance of injectable contraceptive: Secondary | ICD-10-CM

## 2018-01-12 DIAGNOSIS — Z3049 Encounter for surveillance of other contraceptives: Secondary | ICD-10-CM

## 2018-01-12 MED ORDER — MEDROXYPROGESTERONE ACETATE 150 MG/ML IM SUSP: 150.0000 mg | INTRAMUSCULAR | 0 refills | Status: DC

## 2018-01-12 MED ORDER — MEDROXYPROGESTERONE ACETATE 150 MG/ML IM SUSP
150.0000 mg | Freq: Once | INTRAMUSCULAR | Status: AC
  Administered 2018-01-12: 150 mg via INTRAMUSCULAR

## 2018-01-12 MED ORDER — MEDROXYPROGESTERONE ACETATE 150 MG/ML IM SUSP: 150.0000 mg | INTRAMUSCULAR | 1 refills | Status: DC

## 2018-01-12 NOTE — Telephone Encounter (Signed)
Pt was here for depo today. Inquired about how to get refills. Chart reviewed & noted that only 1 Depo was ordered w/0 refills at her 08/17/17 AE visit. She has had to call prior to her last 2 visits to get a refill called in. Advised will send in her last 2 refills to get her to her AE 08/2018.

## 2018-03-15 ENCOUNTER — Telehealth: Payer: Self-pay

## 2018-03-15 NOTE — Telephone Encounter (Signed)
Pt calling triage stating she has a uti, she does not have insurance. Pt wants to know what to do? Pt aware It $78.75 , she can not afford that with the medication.

## 2018-03-16 NOTE — Telephone Encounter (Signed)
Can she call her PCP? [facility]? It would probably be less expensive.

## 2018-03-16 NOTE — Telephone Encounter (Signed)
Pt aware.

## 2018-04-06 ENCOUNTER — Ambulatory Visit (INDEPENDENT_AMBULATORY_CARE_PROVIDER_SITE_OTHER): Payer: Medicaid Other

## 2018-04-06 ENCOUNTER — Ambulatory Visit: Payer: Medicaid Other

## 2018-04-06 DIAGNOSIS — Z3042 Encounter for surveillance of injectable contraceptive: Secondary | ICD-10-CM

## 2018-04-06 MED ORDER — MEDROXYPROGESTERONE ACETATE 150 MG/ML IM SUSP
150.0000 mg | Freq: Once | INTRAMUSCULAR | Status: AC
  Administered 2018-04-06: 150 mg via INTRAMUSCULAR

## 2018-05-22 ENCOUNTER — Encounter: Payer: Self-pay | Admitting: Emergency Medicine

## 2018-05-22 ENCOUNTER — Emergency Department
Admission: EM | Admit: 2018-05-22 | Discharge: 2018-05-22 | Disposition: A | Discharge status: Home / Self Care | Payer: BLUE CROSS/BLUE SHIELD | Attending: Emergency Medicine | Admitting: Emergency Medicine

## 2018-05-22 DIAGNOSIS — G8929 Other chronic pain: Secondary | ICD-10-CM | POA: Insufficient documentation

## 2018-05-22 DIAGNOSIS — M5440 Lumbago with sciatica, unspecified side: Secondary | ICD-10-CM | POA: Insufficient documentation

## 2018-05-22 DIAGNOSIS — Z87891 Personal history of nicotine dependence: Secondary | ICD-10-CM | POA: Insufficient documentation

## 2018-05-22 MED ORDER — TRAMADOL HCL 50 MG PO TABS: 50.0000 mg | ORAL_TABLET | Freq: Four times a day (QID) | ORAL | 0 refills | Status: DC | PRN

## 2018-05-22 MED ORDER — ORPHENADRINE CITRATE 30 MG/ML IJ SOLN
60.0000 mg | Freq: Two times a day (BID) | INTRAMUSCULAR | Status: DC
  Administered 2018-05-22: 60 mg via INTRAMUSCULAR
  Filled 2018-05-22: qty 2

## 2018-05-22 MED ORDER — HYDROMORPHONE HCL 1 MG/ML IJ SOLN
1.0000 mg | Freq: Once | INTRAMUSCULAR | Status: AC
  Administered 2018-05-22: 1 mg via INTRAMUSCULAR
  Filled 2018-05-22: qty 1

## 2018-05-22 MED ORDER — PREDNISONE 20 MG PO TABS
60.0000 mg | ORAL_TABLET | Freq: Once | ORAL | Status: AC
  Administered 2018-05-22: 60 mg via ORAL
  Filled 2018-05-22: qty 3

## 2018-05-22 MED ORDER — CYCLOBENZAPRINE HCL 10 MG PO TABS: 10.0000 mg | ORAL_TABLET | Freq: Three times a day (TID) | ORAL | 0 refills | Status: DC | PRN

## 2018-05-22 MED ORDER — METHYLPREDNISOLONE 4 MG PO TBPK: ORAL_TABLET | ORAL | 0 refills | Status: DC

## 2018-05-22 NOTE — ED Notes (Signed)
See triage note  Presents with lower back pain which radiates into both legs  States she has an injury several years ago and has pain most days  But pain has gotten worse

## 2018-05-22 NOTE — ED Provider Notes (Signed)
[facility] Emergency Department Provider Note   ____________________________________________   First MD Initiated Contact with Patient 05/22/18 1721     (approximate)  I have reviewed the triage vital signs and the nursing notes.   HISTORY  Chief Complaint Back Pain    HPI Debbie Coleman is a 23 y.o. female patient presents with acute non-provocative back pain upon awakening today.  Patient has a history of chronic back pain.  Patient states she had a back injury several years ago.  Patient's past MRI and x-rays were unremarkable.  Patient stated no bladder or bowel dysfunction.  Patient states there is a radicular component to the bilateral legs that extends only to the knees.  Rates the pain as a 10/10.  Described the pain as "sharp/spasmatic".  No palliative measure for complaint.  Past Medical History:  Diagnosis Date  . ADHD (attention deficit hyperactivity disorder)   . Depression   . Family history of breast cancer 08/2017   qualifies for updated genetic testing, may wait till older    Patient Active Problem List   Diagnosis Date Noted  . Depression with anxiety 09/28/2014    Past Surgical History:  Procedure Laterality Date  . FOOT SURGERY Left 09/2004   compound fracture of left foot-had open reduction and a second surgery to remove hardware.  . WISDOM TOOTH EXTRACTION  age 18    Prior to Admission medications   Medication Sig Start Date End Date Taking? Authorizing Provider  cyclobenzaprine (FLEXERIL) 10 MG tablet Take 1 tablet (10 mg total) by mouth 3 (three) times daily as needed. 05/22/18   Sable Feil, PA-C  medroxyPROGESTERone (DEPO-PROVERA) 150 MG/ML injection INJECT 1 ML (150 MG TOTAL) INTO THE MUSCLE EVERY 3 (THREE) MONTHS. 01/12/18   Rod Can, CNM  medroxyPROGESTERone (DEPO-PROVERA) 150 MG/ML injection Inject 1 mL (150 mg total) into the muscle every 3 (three) months. 01/12/18   Dalia Heading, CNM    methylPREDNISolone (MEDROL DOSEPAK) 4 MG TBPK tablet Take Tapered dose as directed 05/22/18   Sable Feil, PA-C  traMADol (ULTRAM) 50 MG tablet Take 1 tablet (50 mg total) by mouth every 6 (six) hours as needed. 05/22/18 05/22/19  Sable Feil, PA-C    Allergies Patient has no known allergies.  Family History  Problem Relation Age of Onset  . Anxiety disorder Mother   . Other Maternal Grandmother        acquired absence of kidney  . Breast cancer Maternal Grandmother 68       BRCA negative not on tamoxifen after unknown estrogen  . Hypertension Maternal Grandmother   . Kidney failure Other     Social History Social History   Tobacco Use  . Smoking status: Former Research scientist (life sciences)  . Smokeless tobacco: Never Used  Substance Use Topics  . Alcohol use: Yes    Comment: 3 x per week  . Drug use: Never    Review of Systems  Constitutional: No fever/chills Eyes: No visual changes. ENT: No sore throat. Cardiovascular: Denies chest pain. Respiratory: Denies shortness of breath. Gastrointestinal: No abdominal pain.  No nausea, no vomiting.  No diarrhea.  No constipation. Genitourinary: Negative for dysuria. Musculoskeletal: Negative for back pain. Skin: Negative for rash. Neurological: Negative for headaches, focal weakness or numbness. Psychiatric:ADHD and depression. ____________________________________________   PHYSICAL EXAM:  VITAL SIGNS: ED Triage Vitals  Enc Vitals Group     BP 05/22/18 1703 117/80     Pulse Rate 05/22/18 1703 75  Resp 05/22/18 1703 16     Temp 05/22/18 1703 98.1 F (36.7 C)     Temp Source 05/22/18 1703 Oral     SpO2 05/22/18 1703 98 %     Weight 05/22/18 1704 120 lb (54.4 kg)     Height 05/22/18 1704 _0  (1.651 m)     Head Circumference --      Peak Flow --      Pain Score 05/22/18 1703 10     Pain Loc --      Pain Edu? --      Excl. in [place]? --    Constitutional: Alert and oriented. Well appearing and in no acute distress. Neck:No  cervical spine tenderness to palpation. Hematological/Lymphatic/Immunilogical: No cervical lymphadenopathy. Cardiovascular: Normal rate, regular rhythm. Grossly normal heart sounds.  Good peripheral circulation. Respiratory: Normal respiratory effort.  No retractions. Lungs CTAB. Gastrointestinal: Soft and nontender. No distention. No abdominal bruits. No CVA tenderness. Genitourinary: Deferred Musculoskeletal: No obvious spinal deformity.  Decreased range of motion is all fields.  Patient is negative straight leg test in the supine position.  No lower extremity tenderness nor edema.  No joint effusions. Neurologic:  Normal speech and language. No gross focal neurologic deficits are appreciated. No gait instability. Skin:  Skin is warm, dry and intact. No rash noted. Psychiatric: Mood and affect are normal. Speech and behavior are normal.  ____________________________________________   LABS (all labs ordered are listed, but only abnormal results are displayed)  Labs Reviewed - No data to display ____________________________________________  EKG   ____________________________________________  RADIOLOGY  ED MD interpretation:    Official radiology report(s): No results found.  ____________________________________________   PROCEDURES  Procedure(s) performed: None  Procedures  Critical Care performed: No  ____________________________________________   INITIAL IMPRESSION / ASSESSMENT AND PLAN / ED COURSE  As part of my medical decision making, I reviewed the following data within the electronic MEDICAL RECORD NUMBER    Acute radicular back pain.  Patient given discharge care instruction.  Patient advised follow-up PCP for continued care.  Take medication as directed.  Advised of the drowsy effect of these medications.  Patient given a work note.      ____________________________________________   FINAL CLINICAL IMPRESSION(S) / ED DIAGNOSES  Final diagnoses:    Chronic midline low back pain with sciatica, sciatica laterality unspecified     ED Discharge Orders         Ordered    methylPREDNISolone (MEDROL DOSEPAK) 4 MG TBPK tablet     05/22/18 1758    cyclobenzaprine (FLEXERIL) 10 MG tablet  3 times daily PRN     05/22/18 1758    traMADol (ULTRAM) 50 MG tablet  Every 6 hours PRN     05/22/18 1758           Note:  This document was prepared using Dragon voice recognition software and may include unintentional dictation errors.    Sable Feil, PA-C 05/22/18 1804    Schaevitz, Randall An, MD 05/22/18 2056

## 2018-05-22 NOTE — ED Triage Notes (Signed)
Pt via pov from home with acute on chronic back pain. She injured it several years ago and had some treatment but has not lately as she has no insurance. Pt noted to walk gingerly and states it is difficult to walk or bend. Pt alert & oriented with nad noted.

## 2018-06-29 ENCOUNTER — Ambulatory Visit: Payer: Medicaid Other

## 2018-07-06 ENCOUNTER — Ambulatory Visit: Payer: Medicaid Other

## 2018-07-06 ENCOUNTER — Ambulatory Visit (INDEPENDENT_AMBULATORY_CARE_PROVIDER_SITE_OTHER): Payer: Medicaid Other

## 2018-07-06 DIAGNOSIS — Z3042 Encounter for surveillance of injectable contraceptive: Secondary | ICD-10-CM

## 2018-07-06 MED ORDER — MEDROXYPROGESTERONE ACETATE 150 MG/ML IM SUSP
150.0000 mg | Freq: Once | INTRAMUSCULAR | Status: AC
  Administered 2018-07-06: 150 mg via INTRAMUSCULAR

## 2018-09-28 ENCOUNTER — Ambulatory Visit: Payer: Medicaid Other

## 2018-09-29 ENCOUNTER — Other Ambulatory Visit: Payer: Self-pay | Admitting: Advanced Practice Midwife

## 2018-09-29 ENCOUNTER — Ambulatory Visit: Payer: Medicaid Other

## 2018-09-29 DIAGNOSIS — Z3042 Encounter for surveillance of injectable contraceptive: Secondary | ICD-10-CM

## 2018-09-29 NOTE — Telephone Encounter (Signed)
Please advise 

## 2018-09-29 NOTE — Telephone Encounter (Signed)
Please send refill 

## 2018-10-02 ENCOUNTER — Ambulatory Visit: Payer: Medicaid Other

## 2018-10-05 ENCOUNTER — Other Ambulatory Visit: Payer: Self-pay

## 2018-10-05 ENCOUNTER — Ambulatory Visit (INDEPENDENT_AMBULATORY_CARE_PROVIDER_SITE_OTHER): Payer: Self-pay

## 2018-10-05 DIAGNOSIS — Z3042 Encounter for surveillance of injectable contraceptive: Secondary | ICD-10-CM

## 2018-10-05 MED ORDER — MEDROXYPROGESTERONE ACETATE 150 MG/ML IM SUSP
150.0000 mg | Freq: Once | INTRAMUSCULAR | Status: AC
  Administered 2018-10-05: 150 mg via INTRAMUSCULAR

## 2018-11-21 NOTE — Progress Notes (Addendum)
Gynecology Annual Exam  PCP: Kirk Ruths, MD  Chief Complaint:  Chief Complaint  Patient presents with  . Gynecologic Exam    History of Present Illness:Debbie Coleman is a 23 year old Caucasian/White female , G 0 P 0 0 0 0 , who presents for her annual gyn exam . She is having no significant GYN problems.  Her menses are absent due to Depo provera (did have one menses last year). Her last dose was 10/05/2018 She has had no spotting.   The patient's past medical history is notable for a history of ADD.   She is sexually active. She is currently using depo provera for contraception. Has had 3/3 Gardasil vaccinations.    There is a positive history of breast cancer in her maternal grandmother. Genetic testing has been done. The relative tested negative for BRCA 1 and negative for BRCA 2.  There is no family history of ovarian cancer.  The patient does do occasional self breast exams.  The patient does not smoke.  The patient does drink 3-4 alcoholic beverages/week. The patient does not use illegal drugs.  The patient has two jobs and stays active, but does not do any formal exercise. The patient may get adequate calcium in her diet.  She has not had a recent cholesterol screen and is not interested in labwork.    Review of Systems: Review of Systems  Constitutional: Negative for chills, fever and weight loss.  HENT: Negative for congestion, sinus pain and sore throat.   Eyes: Negative for blurred vision and pain.  Respiratory: Negative for hemoptysis, shortness of breath and wheezing.   Cardiovascular: Negative for chest pain, palpitations and leg swelling.  Gastrointestinal: Negative for abdominal pain, blood in stool, diarrhea, heartburn, nausea and vomiting.  Genitourinary: Negative for dysuria, frequency, hematuria and urgency.       Positive for amenorrhea  Musculoskeletal: Positive for joint pain. Negative for back pain and myalgias.  Skin: Negative for  itching and rash.  Neurological: Positive for headaches. Negative for dizziness and tingling.  Endo/Heme/Allergies: Negative for environmental allergies and polydipsia. Does not bruise/bleed easily.       Negative for hirsutism   Psychiatric/Behavioral: Negative for depression. The patient is not nervous/anxious and does not have insomnia.     Past Medical History:  Past Medical History:  Diagnosis Date  . ADHD (attention deficit hyperactivity disorder)   . Depression   . Family history of breast cancer 08/2017   qualifies for updated genetic testing, may wait till older    Past Surgical History:  Past Surgical History:  Procedure Laterality Date  . FOOT SURGERY Left 09/2004   compound fracture of left foot-had open reduction and a second surgery to remove hardware.  . WISDOM TOOTH EXTRACTION  age 84    Family History:  Family History  Problem Relation Age of Onset  . Anxiety disorder Mother   . Other Maternal Grandmother        acquired absence of kidney  . Breast cancer Maternal Grandmother 50       BRCA negative not on tamoxifen after unknown estrogen  . Hypertension Maternal Grandmother   . Kidney failure Other     Social History:  Social History   Socioeconomic History  . Marital status: Single    Spouse name: Not on file  . Number of children: 0  . Years of education: Not on file  . Highest education level: Not on file  Occupational  History  . Not on file  Social Needs  . Financial resource strain: Not on file  . Food insecurity    Worry: Not on file    Inability: Not on file  . Transportation needs    Medical: Not on file    Non-medical: Not on file  Tobacco Use  . Smoking status: Former Research scientist (life sciences)  . Smokeless tobacco: Never Used  Substance and Sexual Activity  . Alcohol use: Yes    Comment: 3 x per week  . Drug use: Never  . Sexual activity: Yes    Birth control/protection: Injection  Lifestyle  . Physical activity    Days per week: 0 days     Minutes per session: 0 min  . Stress: To some extent  Relationships  . Social Herbalist on phone: Not on file    Gets together: Not on file    Attends religious service: Not on file    Active member of club or organization: Not on file    Attends meetings of clubs or organizations: Not on file    Relationship status: Not on file  . Intimate partner violence    Fear of current or ex partner: Not on file    Emotionally abused: Not on file    Physically abused: Not on file    Forced sexual activity: Not on file  Other Topics Concern  . Not on file  Social History Narrative  . Not on file    Allergies:  No Known Allergies  Medications:  Current Outpatient Medications:  .  medroxyPROGESTERone (DEPO-PROVERA) 150 MG/ML injection, Inject 1 mL (150 mg total) into the muscle every 3 (three) months., Disp: 1 mL, Rfl: 4  Occasionally hair, skin and nails vitamin    Physical Exam Vitals: .BP 110/72   Pulse 88   Ht '5\' 5"'  (1.651 m)   Wt 127 lb (57.6 kg)   BMI 21.13 kg/m   General: WF in NAD HEENT: normocephalic, anicteric Neck: no thyroid enlargement, no palpable nodules, no cervical lymphadenopathy  Pulmonary: No increased work of breathing, CTAB Cardiovascular: RRR, without murmur  Breast: Breast symmetrical, no tenderness, no palpable nodules or masses, no skin or nipple retraction present, no nipple discharge.  No axillary, infraclavicular or supraclavicular lymphadenopathy. Abdomen: Soft, non-tender, non-distended.  Umbilicus without lesions.  No hepatomegaly or masses palpable. No evidence of hernia. Genitourinary:  External: Normal external female genitalia.  Normal urethral meatus, normal Bartholin's and Skene's glands.    Vagina: Normal vaginal mucosa, no evidence of prolapse.    Cervix: Grossly normal in appearance, no bleeding, non-tender  Uterus: Anteverted, deviated to left, normal size, shape, and consistency, mobile, and non-tender  Adnexa: No adnexal  masses, non-tender  Rectal: deferred  Lymphatic: no evidence of inguinal lymphadenopathy Extremities: no edema, erythema, or tenderness Neurologic: Grossly intact Psychiatric: mood appropriate, affect full     Assessment: 23 y.o. annual gyn exam  Plan:  1) Breast cancer screening - recommend monthly self breast exam.   2) STI screening was offered and accepted.  3) Cervical cancer screening -Paptima done  4) Contraception -No problems on the Depo. Would like to continue. RX: Depo Provera #1/RF x 4. Next dose due 12/28/2018  5) RTO 1 year for Pap smear/ annual exam  Dalia Heading, CNM

## 2018-11-22 ENCOUNTER — Other Ambulatory Visit: Payer: Self-pay

## 2018-11-22 ENCOUNTER — Ambulatory Visit (INDEPENDENT_AMBULATORY_CARE_PROVIDER_SITE_OTHER): Payer: Medicaid Other | Admitting: Certified Nurse Midwife

## 2018-11-22 ENCOUNTER — Other Ambulatory Visit (HOSPITAL_COMMUNITY)
Admission: RE | Admit: 2018-11-22 | Discharge: 2018-11-22 | Disposition: A | Discharge status: Home / Self Care | Payer: Medicaid Other | Source: Ambulatory Visit | Attending: Certified Nurse Midwife | Admitting: Certified Nurse Midwife

## 2018-11-22 ENCOUNTER — Encounter: Payer: Self-pay | Admitting: Certified Nurse Midwife

## 2018-11-22 VITALS — BP 110/72 | HR 88 | Ht 65.0 in | Wt 127.0 lb

## 2018-11-22 DIAGNOSIS — Z01411 Encounter for gynecological examination (general) (routine) with abnormal findings: Secondary | ICD-10-CM | POA: Insufficient documentation

## 2018-11-22 DIAGNOSIS — Z124 Encounter for screening for malignant neoplasm of cervix: Secondary | ICD-10-CM | POA: Diagnosis present

## 2018-11-22 DIAGNOSIS — Z113 Encounter for screening for infections with a predominantly sexual mode of transmission: Secondary | ICD-10-CM | POA: Diagnosis present

## 2018-11-22 DIAGNOSIS — Z309 Encounter for contraceptive management, unspecified: Secondary | ICD-10-CM | POA: Insufficient documentation

## 2018-11-22 DIAGNOSIS — Z0000 Encounter for general adult medical examination without abnormal findings: Secondary | ICD-10-CM

## 2018-11-22 DIAGNOSIS — Z01419 Encounter for gynecological examination (general) (routine) without abnormal findings: Secondary | ICD-10-CM

## 2018-11-22 DIAGNOSIS — Z3042 Encounter for surveillance of injectable contraceptive: Secondary | ICD-10-CM

## 2018-11-22 MED ORDER — MEDROXYPROGESTERONE ACETATE 150 MG/ML IM SUSP: 150.0000 mg | INTRAMUSCULAR | 4 refills | Status: DC

## 2018-11-27 LAB — CYTOLOGY - PAP
Chlamydia: NEGATIVE
Diagnosis: NEGATIVE
Diagnosis: REACTIVE
Neisseria Gonorrhea: NEGATIVE

## 2018-12-28 ENCOUNTER — Ambulatory Visit: Payer: Medicaid Other

## 2018-12-30 ENCOUNTER — Other Ambulatory Visit: Payer: Self-pay | Admitting: Advanced Practice Midwife

## 2018-12-30 DIAGNOSIS — Z3042 Encounter for surveillance of injectable contraceptive: Secondary | ICD-10-CM

## 2019-01-04 ENCOUNTER — Ambulatory Visit (INDEPENDENT_AMBULATORY_CARE_PROVIDER_SITE_OTHER): Payer: Medicaid Other

## 2019-01-04 ENCOUNTER — Other Ambulatory Visit: Payer: Self-pay

## 2019-01-04 ENCOUNTER — Other Ambulatory Visit: Payer: Self-pay | Admitting: Advanced Practice Midwife

## 2019-01-04 DIAGNOSIS — Z3042 Encounter for surveillance of injectable contraceptive: Secondary | ICD-10-CM

## 2019-01-04 MED ORDER — MEDROXYPROGESTERONE ACETATE 150 MG/ML IM SUSP
150.0000 mg | Freq: Once | INTRAMUSCULAR | Status: AC
  Administered 2019-01-04: 150 mg via INTRAMUSCULAR

## 2019-03-06 ENCOUNTER — Other Ambulatory Visit: Payer: Self-pay

## 2019-03-06 ENCOUNTER — Encounter: Payer: Self-pay | Admitting: Emergency Medicine

## 2019-03-06 ENCOUNTER — Emergency Department
Admission: EM | Admit: 2019-03-06 | Discharge: 2019-03-06 | Disposition: A | Discharge status: Home / Self Care | Payer: Medicaid Other | Attending: Student | Admitting: Student

## 2019-03-06 DIAGNOSIS — S81812A Laceration without foreign body, left lower leg, initial encounter: Secondary | ICD-10-CM | POA: Insufficient documentation

## 2019-03-06 DIAGNOSIS — Y9389 Activity, other specified: Secondary | ICD-10-CM | POA: Insufficient documentation

## 2019-03-06 DIAGNOSIS — Z87891 Personal history of nicotine dependence: Secondary | ICD-10-CM | POA: Insufficient documentation

## 2019-03-06 DIAGNOSIS — W540XXA Bitten by dog, initial encounter: Secondary | ICD-10-CM | POA: Insufficient documentation

## 2019-03-06 DIAGNOSIS — Y999 Unspecified external cause status: Secondary | ICD-10-CM | POA: Insufficient documentation

## 2019-03-06 DIAGNOSIS — Y92019 Unspecified place in single-family (private) house as the place of occurrence of the external cause: Secondary | ICD-10-CM | POA: Insufficient documentation

## 2019-03-06 DIAGNOSIS — Z23 Encounter for immunization: Secondary | ICD-10-CM | POA: Insufficient documentation

## 2019-03-06 MED ORDER — LIDOCAINE HCL (PF) 1 % IJ SOLN
5.0000 mL | Freq: Once | INTRAMUSCULAR | Status: AC
  Administered 2019-03-06: 5 mL via INTRADERMAL
  Filled 2019-03-06: qty 5

## 2019-03-06 MED ORDER — AMOXICILLIN-POT CLAVULANATE 875-125 MG PO TABS: 1.0000 | ORAL_TABLET | Freq: Two times a day (BID) | ORAL | 0 refills | Status: AC

## 2019-03-06 MED ORDER — LIDOCAINE HCL (PF) 1 % IJ SOLN
5.0000 mL | Freq: Once | INTRAMUSCULAR | Status: DC
  Filled 2019-03-06: qty 5

## 2019-03-06 MED ORDER — LIDOCAINE HCL (PF) 1 % IJ SOLN
INTRAMUSCULAR | Status: AC
  Filled 2019-03-06: qty 5

## 2019-03-06 MED ORDER — AMOXICILLIN-POT CLAVULANATE 875-125 MG PO TABS
1.0000 | ORAL_TABLET | Freq: Once | ORAL | Status: AC
  Administered 2019-03-06: 1 via ORAL
  Filled 2019-03-06: qty 1

## 2019-03-06 MED ORDER — TETANUS-DIPHTH-ACELL PERTUSSIS 5-2.5-18.5 LF-MCG/0.5 IM SUSP
0.5000 mL | Freq: Once | INTRAMUSCULAR | Status: AC
  Administered 2019-03-06: 0.5 mL via INTRAMUSCULAR
  Filled 2019-03-06: qty 0.5

## 2019-03-06 NOTE — ED Triage Notes (Signed)
Pt to triage via w/c, mask in place with no distress noted; reports bit by one of her dogs to left lower leg when it "got mad at the others"; punture to inner and outer calf with no active bleeding

## 2019-03-06 NOTE — ED Notes (Signed)
Reported to [facility] 

## 2019-03-06 NOTE — ED Provider Notes (Signed)
[facility] Emergency Department Provider Note  ____________________________________________  Time seen: Approximately 9:57 PM  I have reviewed the triage vital signs and the nursing notes.   HISTORY  Chief Complaint Animal Bite    HPI Debbie Coleman is a 23 y.o. female that presents to emergency department for evaluation of left calf wound from dog bite.  Patient was bit by one of her dogs when she was trying to break up a dog fight.  Dog vaccinations are up-to-date including rabies.  She is unsure of her last tetanus.  Past Medical History:  Diagnosis Date  . ADHD (attention deficit hyperactivity disorder)   . Depression   . Family history of breast cancer 08/2017   qualifies for updated genetic testing, may wait till older    Patient Active Problem List   Diagnosis Date Noted  . Contraception management 11/22/2018  . Depression with anxiety 09/28/2014    Past Surgical History:  Procedure Laterality Date  . FOOT SURGERY Left 09/2004   compound fracture of left foot-had open reduction and a second surgery to remove hardware.  . WISDOM TOOTH EXTRACTION  age 14    Prior to Admission medications   Medication Sig Start Date End Date Taking? Authorizing Provider  amoxicillin-clavulanate (AUGMENTIN) 875-125 MG tablet Take 1 tablet by mouth 2 (two) times daily for 10 days. 03/06/19 03/16/19  Laban Emperor, PA-C  medroxyPROGESTERone (DEPO-PROVERA) 150 MG/ML injection INJECT 1 ML (150 MG TOTAL) INTO THE MUSCLE EVERY 3 (THREE) MONTHS. 01/04/19   Dalia Heading, CNM    Allergies Patient has no known allergies.  Family History  Problem Relation Age of Onset  . Anxiety disorder Mother   . Other Maternal Grandmother        acquired absence of kidney  . Breast cancer Maternal Grandmother 42       BRCA negative not on tamoxifen after unknown estrogen  . Hypertension Maternal Grandmother   . Kidney failure Other     Social History Social  History   Tobacco Use  . Smoking status: Former Research scientist (life sciences)  . Smokeless tobacco: Never Used  Substance Use Topics  . Alcohol use: Yes    Comment: 3 x per week  . Drug use: Never     Review of Systems  Constitutional: No fever/chills Respiratory: No SOB. Gastrointestinal: No abdominal pain.  No nausea, no vomiting.  Musculoskeletal: Negative for musculoskeletal pain. Skin: Negative for rash, abrasions, lacerations, ecchymosis. Neurological: Negative for headaches, numbness or tingling   ____________________________________________   PHYSICAL EXAM:  VITAL SIGNS: ED Triage Vitals [03/06/19 2050]  Enc Vitals Group     BP (!) 127/96     Pulse Rate 93     Resp 18     Temp 98 F (36.7 C)     Temp Source Oral     SpO2 99 %     Weight 120 lb (54.4 kg)     Height _0  (1.651 m)     Head Circumference      Peak Flow      Pain Score 5     Pain Loc      Pain Edu?      Excl. in [place]?      Constitutional: Alert and oriented. Well appearing and in no acute distress. Eyes: Conjunctivae are normal. PERRL. EOMI. Head: Atraumatic. ENT:      Ears:      Nose: No congestion/rhinnorhea.      Mouth/Throat: Mucous membranes are moist.  Neck: No  stridor.  Cardiovascular: Normal rate, regular rhythm.  Good peripheral circulation. Respiratory: Normal respiratory effort without tachypnea or retractions. Lungs CTAB. Good air entry to the bases with no decreased or absent breath sounds. Musculoskeletal: Full range of motion to all extremities. No gross deformities appreciated. Neurologic:  Normal speech and language. No gross focal neurologic deficits are appreciated.  Skin:  Skin is warm, dry.  3 cm laceration to medial left calf.  1/2 cm laceration to distal medial left calf.  1 cm triangle shaped laceration to lateral left calf.  Several small abrasions and ecchymosis. Psychiatric: Mood and affect are normal. Speech and behavior are normal. Patient exhibits appropriate insight and  judgement.   ____________________________________________   LABS (all labs ordered are listed, but only abnormal results are displayed)  Labs Reviewed - No data to display ____________________________________________  EKG   ____________________________________________  RADIOLOGY   No results found.  ____________________________________________    PROCEDURES  Procedure(s) performed:    Procedures  LACERATION REPAIR Performed by: Laban Emperor  Consent: Verbal consent obtained.  Consent given by: patient  Prepped and Draped in normal sterile fashion  Wound explored: No foreign bodies   Laceration Location: left calf  Laceration Length: 3 cm  Anesthesia: None  Local anesthetic: lidocaine 1% without epinephrine  Anesthetic total: 5 ml  Irrigation method: syringe  Amount of cleaning: 561m normal saline  Skin closure: 4-0 nylon  Number of sutures: 8  Technique: Simple interrupted  Patient tolerance: Patient tolerated the procedure well with no immediate complications.  LACERATION REPAIR Performed by: ALaban Emperor Consent: Verbal consent obtained.  Consent given by: patient  Prepped and Draped in normal sterile fashion  Wound explored: No foreign bodies   Laceration Location: left calf  Laceration Length: 1 cm triangular  Anesthesia: None  Local anesthetic: lidocaine 1% without epinephrine  Anesthetic total: 5 ml  Irrigation method: syringe  Amount of cleaning: 5035mnormal saline  Skin closure: 4-0 nylon  Number of sutures: 5  Technique: Simple interrupted  Patient tolerance: Patient tolerated the procedure well with no immediate complications.  LACERATION REPAIR Performed by: AsLaban EmperorConsent: Verbal consent obtained.  Consent given by: patient  Prepped and Draped in normal sterile fashion  Wound explored: No foreign bodies   Laceration Location: left calf  Laceration Length: 1/2 cm  Anesthesia:  None  Local anesthetic: lidocaine 1% without epinephrine  Anesthetic total: 2  ml  Irrigation method: syringe  Amount of cleaning: 50058mormal saline  Skin closure: 4-0 nylon  Number of sutures: 1  Technique: Simple interrupted  Patient tolerance: Patient tolerated the procedure well with no immediate complications.  Medications  lidocaine (PF) (XYLOCAINE) 1 % injection 5 mL (has no administration in time range)  lidocaine (PF) (XYLOCAINE) 1 % injection (has no administration in time range)  amoxicillin-clavulanate (AUGMENTIN) 875-125 MG per tablet 1 tablet (has no administration in time range)  lidocaine (PF) (XYLOCAINE) 1 % injection 5 mL (5 mLs Intradermal Given 03/06/19 2143)  Tdap (BOOSTRIX) injection 0.5 mL (0.5 mLs Intramuscular Given 03/06/19 2142)     ____________________________________________   INITIAL IMPRESSION / ASSESSMENT AND PLAN / ED COURSE  Pertinent labs & imaging results that were available during my care of the patient were reviewed by me and considered in my medical decision making (see chart for details).  Review of the [place] CSRS [place] of the [place] any [place] drugs.   Patient's diagnosis is consistent with dog bite.  Exam are reassuring.  Lacerations were repaired with stitches.  Tetanus shot was updated.  Dog is up-to-date with vaccinations.  Dose of Augmentin was given.  Patient will be discharged home with prescriptions for Augmentin. Patient is to follow up with primary care as directed. Patient is given ED precautions to return to the ED for any worsening or new symptoms.  Debbie Coleman was evaluated in Emergency Department on 03/06/2019 for the symptoms described in the history of present illness. She was evaluated in the context of the global COVID-19 pandemic, which necessitated consideration that the patient might be at risk for infection with the SARS-CoV-2 virus that causes COVID-19.  Institutional protocols and algorithms that pertain to the evaluation of patients at risk for COVID-19 are in a state of rapid change based on information released by regulatory bodies including the CDC and federal and state organizations. These policies and algorithms were followed during the patient's care in the ED.   ____________________________________________  FINAL CLINICAL IMPRESSION(S) / ED DIAGNOSES  Final diagnoses:  Dog bite, initial encounter      NEW MEDICATIONS STARTED DURING THIS VISIT:  ED Discharge Orders         Ordered    amoxicillin-clavulanate (AUGMENTIN) 875-125 MG tablet  2 times daily     03/06/19 2242              This chart was dictated using voice recognition software/Dragon. Despite best efforts to proofread, errors can occur which can change the meaning. Any change was purely unintentional.    Laban Emperor, PA-C 03/06/19 2312    Lilia Pro., MD 03/07/19 [phone]

## 2019-03-13 ENCOUNTER — Other Ambulatory Visit: Payer: Self-pay

## 2019-03-13 ENCOUNTER — Encounter: Payer: Self-pay | Admitting: Emergency Medicine

## 2019-03-13 ENCOUNTER — Emergency Department
Admission: EM | Admit: 2019-03-13 | Discharge: 2019-03-13 | Disposition: A | Discharge status: Home / Self Care | Payer: Medicaid Other | Attending: Emergency Medicine | Admitting: Emergency Medicine

## 2019-03-13 DIAGNOSIS — Z87891 Personal history of nicotine dependence: Secondary | ICD-10-CM | POA: Insufficient documentation

## 2019-03-13 DIAGNOSIS — S81812D Laceration without foreign body, left lower leg, subsequent encounter: Secondary | ICD-10-CM | POA: Insufficient documentation

## 2019-03-13 DIAGNOSIS — Z79899 Other long term (current) drug therapy: Secondary | ICD-10-CM | POA: Insufficient documentation

## 2019-03-13 DIAGNOSIS — Z4802 Encounter for removal of sutures: Secondary | ICD-10-CM

## 2019-03-13 DIAGNOSIS — W540XXD Bitten by dog, subsequent encounter: Secondary | ICD-10-CM | POA: Insufficient documentation

## 2019-03-13 NOTE — ED Triage Notes (Signed)
Here for suture removal to left lower leg mi  Swelling noted with some bruising

## 2019-03-13 NOTE — ED Provider Notes (Signed)
[facility] Emergency Department Provider Note  ____________________________________________  Time seen: Approximately 1:38 PM  I have reviewed the triage vital signs and the nursing notes.   HISTORY  Chief Complaint Suture / Staple Removal    HPI Debbie Coleman is a 23 y.o. female that presents to the emergency department for suture removal.  Patient denies any drainage from sutures.  She is taking antibiotics as prescribed.  She is still having some swelling and bruising to area.  She has not had any swelling today.  Lacerations are from a dog bite.  Past Medical History:  Diagnosis Date  . ADHD (attention deficit hyperactivity disorder)   . Depression   . Family history of breast cancer 08/2017   qualifies for updated genetic testing, may wait till older    Patient Active Problem List   Diagnosis Date Noted  . Contraception management 11/22/2018  . Depression with anxiety 09/28/2014    Past Surgical History:  Procedure Laterality Date  . FOOT SURGERY Left 09/2004   compound fracture of left foot-had open reduction and a second surgery to remove hardware.  . WISDOM TOOTH EXTRACTION  age 74    Prior to Admission medications   Medication Sig Start Date End Date Taking? Authorizing Provider  amoxicillin-clavulanate (AUGMENTIN) 875-125 MG tablet Take 1 tablet by mouth 2 (two) times daily for 10 days. 03/06/19 03/16/19  Laban Emperor, PA-C  medroxyPROGESTERone (DEPO-PROVERA) 150 MG/ML injection INJECT 1 ML (150 MG TOTAL) INTO THE MUSCLE EVERY 3 (THREE) MONTHS. 01/04/19   Dalia Heading, CNM    Allergies Patient has no known allergies.  Family History  Problem Relation Age of Onset  . Anxiety disorder Mother   . Other Maternal Grandmother        acquired absence of kidney  . Breast cancer Maternal Grandmother 48       BRCA negative not on tamoxifen after unknown estrogen  . Hypertension Maternal Grandmother   . Kidney failure Other      Social History Social History   Tobacco Use  . Smoking status: Former Research scientist (life sciences)  . Smokeless tobacco: Never Used  Substance Use Topics  . Alcohol use: Yes    Comment: 3 x per week  . Drug use: Never     Review of Systems  Constitutional: No fever/chills Gastrointestinal:  No nausea, no vomiting.  Musculoskeletal: Negative for musculoskeletal pain. Skin: Negative for rash, ecchymosis.  Positive for sutured laceration.   ____________________________________________   PHYSICAL EXAM:  VITAL SIGNS: ED Triage Vitals  Enc Vitals Group     BP 03/13/19 1239 125/75     Pulse Rate 03/13/19 1239 78     Resp 03/13/19 1239 19     Temp 03/13/19 1239 98 F (36.7 C)     Temp Source 03/13/19 1239 Oral     SpO2 03/13/19 1239 98 %     Weight 03/13/19 1240 119 lb 14.9 oz (54.4 kg)     Height --      Head Circumference --      Peak Flow --      Pain Score 03/13/19 1240 2     Pain Loc --      Pain Edu? --      Excl. in [place]? --      Constitutional: Alert and oriented. Well appearing and in no acute distress. Eyes: Conjunctivae are normal. PERRL. EOMI. Head: Atraumatic. ENT:      Ears:      Nose: No congestion/rhinnorhea.  Mouth/Throat: Mucous membranes are moist.  Neck: No stridor.   Cardiovascular: Normal rate, regular rhythm.  Good peripheral circulation. Respiratory: Normal respiratory effort without tachypnea or retractions. Lungs CTAB. Good air entry to the bases with no decreased or absent breath sounds. Musculoskeletal: Full range of motion to all extremities. No gross deformities appreciated. Neurologic:  Normal speech and language. No gross focal neurologic deficits are appreciated.  Skin:  Skin is warm, dry.  5 sutured laceration to left lateral calf.  8 sutures laceration to left medial class.  1 sutured laceration to left medial calf below longer laceration.  No visible drainage.  Some surrounding irritation. Scabs in place. Psychiatric: Mood and affect are  normal. Speech and behavior are normal. Patient exhibits appropriate insight and judgement.   ____________________________________________   LABS (all labs ordered are listed, but only abnormal results are displayed)  Labs Reviewed - No data to display ____________________________________________  EKG   ____________________________________________  RADIOLOGY  No results found.  ____________________________________________    PROCEDURES  Procedure(s) performed:    Procedures  SUTURE REMOVAL Performed by: Laban Emperor  Consent: Verbal consent obtained. Patient identity confirmed: provided demographic data Time out: Immediately prior to procedure a "time out" was called to verify the correct patient, procedure, equipment, support staff and site/side marked as required.  Location details: leg  Wound Appearance: clean  Sutures/Staples Removed: 6  Facility: sutures placed in this facility Patient tolerance: Patient tolerated the procedure well with no immediate complications.  SUTURE REMOVAL Performed by: Laban Emperor  Consent: Verbal consent obtained. Patient identity confirmed: provided demographic data Time out: Immediately prior to procedure a "time out" was called to verify the correct patient, procedure, equipment, support staff and site/side marked as required.  Location details: leg  Wound Appearance: clean  Sutures/Staples Removed: 4  Facility: sutures placed in this facility Patient tolerance: Patient tolerated the procedure well with no immediate complications.  SUTURE REMOVAL Performed by: Laban Emperor  Consent: Verbal consent obtained. Patient identity confirmed: provided demographic data Time out: Immediately prior to procedure a "time out" was called to verify the correct patient, procedure, equipment, support staff and site/side marked as required.  Location details: leg  Wound Appearance: clean  Sutures/Staples Removed:  1  Facility: sutures placed in this facility Patient tolerance: Patient tolerated the procedure well with no immediate complications.   Medications - No data to display   ____________________________________________   INITIAL IMPRESSION / ASSESSMENT AND PLAN / ED COURSE  Pertinent labs & imaging results that were available during my care of the patient were reviewed by me and considered in my medical decision making (see chart for details).  Review of the [place] CSRS was performed in accordance of the [place] prior to dispensing any controlled drugs.     Patient presented to the emergency department for suture removal.  Vital signs and exam are reassuring.  2 sutures were left in place to medial laceration.  One suture was left in place to lateral laceration.  Sutures were left in place, as laceration was starting to reopen with removal.  Patient will return to the emergency department in 2 days for suture removal.  Patient will continue Augmentin.  Patient is to follow up with primary care emergency department as directed. Patient is given ED precautions to return to the ED for any worsening or new symptoms.   Debbie Coleman was evaluated in Emergency Department on 03/13/2019 for the symptoms described in the history of present illness. She was evaluated in  the context of the global COVID-19 pandemic, which necessitated consideration that the patient might be at risk for infection with the SARS-CoV-2 virus that causes COVID-19. Institutional protocols and algorithms that pertain to the evaluation of patients at risk for COVID-19 are in a state of rapid change based on information released by regulatory bodies including the CDC and federal and state organizations. These policies and algorithms were followed during the patient's care in the ED.  ____________________________________________  FINAL CLINICAL IMPRESSION(S) / ED DIAGNOSES  Final diagnoses:  Visit for suture removal       NEW MEDICATIONS STARTED DURING THIS VISIT:  ED Discharge Orders    None          This chart was dictated using voice recognition software/Dragon. Despite best efforts to proofread, errors can occur which can change the meaning. Any change was purely unintentional.    Laban Emperor, PA-C 03/13/19 1342    Arta Silence, MD 03/13/19 [phone]

## 2019-03-15 ENCOUNTER — Encounter: Payer: Self-pay | Admitting: Emergency Medicine

## 2019-03-15 ENCOUNTER — Emergency Department
Admission: EM | Admit: 2019-03-15 | Discharge: 2019-03-15 | Disposition: A | Discharge status: Home / Self Care | Payer: Medicaid Other | Attending: Emergency Medicine | Admitting: Emergency Medicine

## 2019-03-15 ENCOUNTER — Other Ambulatory Visit: Payer: Self-pay

## 2019-03-15 DIAGNOSIS — S81812D Laceration without foreign body, left lower leg, subsequent encounter: Secondary | ICD-10-CM | POA: Insufficient documentation

## 2019-03-15 DIAGNOSIS — Z4802 Encounter for removal of sutures: Secondary | ICD-10-CM

## 2019-03-15 DIAGNOSIS — W540XXD Bitten by dog, subsequent encounter: Secondary | ICD-10-CM | POA: Insufficient documentation

## 2019-03-15 DIAGNOSIS — Z793 Long term (current) use of hormonal contraceptives: Secondary | ICD-10-CM | POA: Insufficient documentation

## 2019-03-15 DIAGNOSIS — Z87891 Personal history of nicotine dependence: Secondary | ICD-10-CM | POA: Insufficient documentation

## 2019-03-15 NOTE — ED Triage Notes (Signed)
Here for suture removal  States she had part of her sutures removed 2 days ago   Was tolde to come back to have th rest removed

## 2019-03-15 NOTE — ED Provider Notes (Signed)
[facility] Emergency Department Provider Note  ____________________________________________   First MD Initiated Contact with Patient 03/15/19 1727     (approximate)  I have reviewed the triage vital signs and the nursing notes.   HISTORY  Chief Complaint Suture / Staple Removal    HPI Debbie Coleman is a 23 y.o. female presents emergency department for suture removal.  Patient had dog bite approximately 9 to 10 days ago.  Patient is on Augmentin to prevent infection.    Past Medical History:  Diagnosis Date  . ADHD (attention deficit hyperactivity disorder)   . Depression   . Family history of breast cancer 08/2017   qualifies for updated genetic testing, may wait till older    Patient Active Problem List   Diagnosis Date Noted  . Contraception management 11/22/2018  . Depression with anxiety 09/28/2014    Past Surgical History:  Procedure Laterality Date  . FOOT SURGERY Left 09/2004   compound fracture of left foot-had open reduction and a second surgery to remove hardware.  . WISDOM TOOTH EXTRACTION  age 26    Prior to Admission medications   Medication Sig Start Date End Date Taking? Authorizing Provider  amoxicillin-clavulanate (AUGMENTIN) 875-125 MG tablet Take 1 tablet by mouth 2 (two) times daily for 10 days. 03/06/19 03/16/19  Laban Emperor, PA-C  medroxyPROGESTERone (DEPO-PROVERA) 150 MG/ML injection INJECT 1 ML (150 MG TOTAL) INTO THE MUSCLE EVERY 3 (THREE) MONTHS. 01/04/19   Dalia Heading, CNM    Allergies Patient has no known allergies.  Family History  Problem Relation Age of Onset  . Anxiety disorder Mother   . Other Maternal Grandmother        acquired absence of kidney  . Breast cancer Maternal Grandmother 75       BRCA negative not on tamoxifen after unknown estrogen  . Hypertension Maternal Grandmother   . Kidney failure Other     Social History Social History   Tobacco Use  . Smoking status:  Former Research scientist (life sciences)  . Smokeless tobacco: Never Used  Substance Use Topics  . Alcohol use: Yes    Comment: 3 x per week  . Drug use: Never    Review of Systems  Constitutional: No fever/chills Eyes: No visual changes. ENT: No sore throat. Respiratory: Denies cough Genitourinary: Negative for dysuria. Musculoskeletal: Negative for back pain. Skin: Negative for rash.  Here for suture removal    ____________________________________________   PHYSICAL EXAM:  VITAL SIGNS: ED Triage Vitals [03/15/19 1729]  Enc Vitals Group     BP 117/60     Pulse Rate 95     Resp 18     Temp 98.8 F (37.1 C)     Temp Source Oral     SpO2 98 %     Weight 120 lb (54.4 kg)     Height _0  (1.651 m)     Head Circumference      Peak Flow      Pain Score 0     Pain Loc      Pain Edu?      Excl. in [place]?     Constitutional: Alert and oriented. Well appearing and in no acute distress. Eyes: Conjunctivae are normal.  Head: Atraumatic. Nose: No congestion/rhinnorhea. Mouth/Throat: Mucous membranes are moist.   Cardiovascular: Normal rate, regular rhythm.  Respiratory: Normal respiratory effort.  No retractions, GU: deferred Musculoskeletal: FROM all extremities, warm and well perfused Neurologic:  Normal speech and language.  Skin:  Skin  is warm, dry.  Sutures are intact, wound appears to be well approximated with no drainage.  Suture reaction is noted at suture line.   Psychiatric: Mood and affect are normal. Speech and behavior are normal.  ____________________________________________   LABS (all labs ordered are listed, but only abnormal results are displayed)  Labs Reviewed - No data to display ____________________________________________   ____________________________________________  RADIOLOGY    ____________________________________________   PROCEDURES  Procedure(s) performed: Sutures removed and Steri-Strips placed   Procedures     ____________________________________________   INITIAL IMPRESSION / ASSESSMENT AND PLAN / ED COURSE  Pertinent labs & imaging results that were available during my care of the patient were reviewed by me and considered in my medical decision making (see chart for details).   Patient's 23 year old female presents emergency department for suture removal.  Sutures were removed and Steri-Strips placed to anchor the wound edges.  Patient was discharged stable condition.    Debbie Coleman was evaluated in Emergency Department on 03/15/2019 for the symptoms described in the history of present illness. She was evaluated in the context of the global COVID-19 pandemic, which necessitated consideration that the patient might be at risk for infection with the SARS-CoV-2 virus that causes COVID-19. Institutional protocols and algorithms that pertain to the evaluation of patients at risk for COVID-19 are in a state of rapid change based on information released by regulatory bodies including the CDC and federal and state organizations. These policies and algorithms were followed during the patient's care in the ED.   As part of my medical decision making, I reviewed the following data within the [place] notes reviewed and incorporated, Old chart reviewed, Notes from prior ED visits and [place] Controlled Substance Database  ____________________________________________   FINAL CLINICAL IMPRESSION(S) / ED DIAGNOSES  Final diagnoses:  Visit for suture removal      NEW MEDICATIONS STARTED DURING THIS VISIT:  New Prescriptions   No medications on file     Note:  This document was prepared using Dragon voice recognition software and may include unintentional dictation errors.    Versie Starks, PA-C 03/15/19 1748    Carrie Mew, MD 03/16/19 2057

## 2019-03-15 NOTE — Discharge Instructions (Addendum)
Keep the areas dry as possible.  Finish your antibiotic.  If you develop more redness or any pus or drainage please return the emergency department.  The Steri-Strips will peel off on their own.

## 2019-03-27 ENCOUNTER — Other Ambulatory Visit: Payer: Self-pay

## 2019-03-27 ENCOUNTER — Ambulatory Visit (INDEPENDENT_AMBULATORY_CARE_PROVIDER_SITE_OTHER): Payer: Medicaid Other

## 2019-03-27 DIAGNOSIS — Z3042 Encounter for surveillance of injectable contraceptive: Secondary | ICD-10-CM | POA: Diagnosis not present

## 2019-03-27 MED ORDER — MEDROXYPROGESTERONE ACETATE 150 MG/ML IM SUSP
150.0000 mg | Freq: Once | INTRAMUSCULAR | Status: AC
  Administered 2019-03-27: 150 mg via INTRAMUSCULAR

## 2019-03-27 NOTE — Progress Notes (Signed)
Patient presents today for Depo Provera injection within dates. Given IM LUOQ. Patient tolerated well. 

## 2019-06-19 ENCOUNTER — Ambulatory Visit: Payer: Medicaid Other

## 2019-06-22 ENCOUNTER — Ambulatory Visit: Payer: Medicaid Other

## 2019-06-22 ENCOUNTER — Other Ambulatory Visit: Payer: Self-pay

## 2019-06-22 ENCOUNTER — Ambulatory Visit (INDEPENDENT_AMBULATORY_CARE_PROVIDER_SITE_OTHER): Payer: Medicaid Other

## 2019-06-22 DIAGNOSIS — Z3042 Encounter for surveillance of injectable contraceptive: Secondary | ICD-10-CM

## 2019-06-22 MED ORDER — MEDROXYPROGESTERONE ACETATE 150 MG/ML IM SUSP
150.0000 mg | Freq: Once | INTRAMUSCULAR | Status: AC
  Administered 2019-06-22: 150 mg via INTRAMUSCULAR

## 2019-09-14 ENCOUNTER — Ambulatory Visit: Payer: Medicaid Other

## 2019-09-20 ENCOUNTER — Ambulatory Visit (INDEPENDENT_AMBULATORY_CARE_PROVIDER_SITE_OTHER): Payer: Medicaid Other

## 2019-09-20 ENCOUNTER — Ambulatory Visit: Payer: Medicaid Other

## 2019-09-20 ENCOUNTER — Other Ambulatory Visit: Payer: Self-pay

## 2019-09-20 DIAGNOSIS — Z3042 Encounter for surveillance of injectable contraceptive: Secondary | ICD-10-CM

## 2019-09-20 MED ORDER — MEDROXYPROGESTERONE ACETATE 150 MG/ML IM SUSP
150.0000 mg | Freq: Once | INTRAMUSCULAR | Status: AC
  Administered 2019-09-20: 150 mg via INTRAMUSCULAR

## 2019-09-20 NOTE — Progress Notes (Signed)
Patient presents today for Depo Provera injection within dates. Given IM RUOQ. Patient tolerated well. 

## 2019-11-27 ENCOUNTER — Other Ambulatory Visit: Payer: Self-pay | Admitting: Certified Nurse Midwife

## 2019-11-27 DIAGNOSIS — Z3042 Encounter for surveillance of injectable contraceptive: Secondary | ICD-10-CM

## 2019-12-13 ENCOUNTER — Other Ambulatory Visit: Payer: Self-pay

## 2019-12-13 ENCOUNTER — Ambulatory Visit: Payer: Medicaid Other

## 2019-12-13 DIAGNOSIS — Z3042 Encounter for surveillance of injectable contraceptive: Secondary | ICD-10-CM

## 2019-12-13 MED ORDER — MEDROXYPROGESTERONE ACETATE 150 MG/ML IM SUSP: 150.0000 mg | INTRAMUSCULAR | 0 refills | Status: DC

## 2019-12-13 NOTE — Telephone Encounter (Signed)
Please advise if refill is appropriate 

## 2019-12-17 ENCOUNTER — Ambulatory Visit (INDEPENDENT_AMBULATORY_CARE_PROVIDER_SITE_OTHER): Payer: Self-pay

## 2019-12-17 ENCOUNTER — Other Ambulatory Visit: Payer: Self-pay

## 2019-12-17 DIAGNOSIS — Z3042 Encounter for surveillance of injectable contraceptive: Secondary | ICD-10-CM

## 2019-12-17 MED ORDER — MEDROXYPROGESTERONE ACETATE 150 MG/ML IM SUSP
150.0000 mg | Freq: Once | INTRAMUSCULAR | Status: AC
  Administered 2019-12-17: 150 mg via INTRAMUSCULAR

## 2019-12-17 NOTE — Progress Notes (Signed)
Pt here for depo which was given IM right glut.  NDC# [id number] 

## 2020-01-01 ENCOUNTER — Ambulatory Visit: Payer: Medicaid Other | Admitting: Obstetrics

## 2020-01-31 ENCOUNTER — Other Ambulatory Visit (HOSPITAL_COMMUNITY)
Admission: RE | Admit: 2020-01-31 | Discharge: 2020-01-31 | Disposition: A | Discharge status: Home / Self Care | Payer: Medicaid Other | Source: Ambulatory Visit | Attending: Obstetrics | Admitting: Obstetrics

## 2020-01-31 ENCOUNTER — Ambulatory Visit (INDEPENDENT_AMBULATORY_CARE_PROVIDER_SITE_OTHER): Payer: Medicaid Other | Admitting: Obstetrics

## 2020-01-31 ENCOUNTER — Other Ambulatory Visit: Payer: Self-pay

## 2020-01-31 ENCOUNTER — Encounter: Payer: Self-pay | Admitting: Obstetrics

## 2020-01-31 VITALS — BP 112/70 | HR 69 | Resp 16 | Ht 65.0 in | Wt 122.8 lb

## 2020-01-31 DIAGNOSIS — Z01419 Encounter for gynecological examination (general) (routine) without abnormal findings: Secondary | ICD-10-CM | POA: Diagnosis not present

## 2020-01-31 DIAGNOSIS — F988 Other specified behavioral and emotional disorders with onset usually occurring in childhood and adolescence: Secondary | ICD-10-CM | POA: Insufficient documentation

## 2020-01-31 DIAGNOSIS — R5383 Other fatigue: Secondary | ICD-10-CM | POA: Diagnosis not present

## 2020-01-31 DIAGNOSIS — S92902A Unspecified fracture of left foot, initial encounter for closed fracture: Secondary | ICD-10-CM

## 2020-01-31 DIAGNOSIS — G43909 Migraine, unspecified, not intractable, without status migrainosus: Secondary | ICD-10-CM | POA: Insufficient documentation

## 2020-01-31 HISTORY — DX: Migraine, unspecified, not intractable, without status migrainosus: G43.909

## 2020-01-31 HISTORY — DX: Unspecified fracture of left foot, initial encounter for closed fracture: S92.902A

## 2020-01-31 NOTE — Progress Notes (Signed)
Gynecology Annual Exam  PCP: Kirk Ruths, MD  Chief Complaint:  Chief Complaint  Patient presents with  . Gynecologic Exam    History of Present Illness:  Ms. Debbie Coleman is a 24 y.o. G0P0000 who LMP was No LMP recorded. Patient has had an injection., presents today for her annual examination.  Her menses are she does not get periods due to being on Depo, lasting 0    Dysmenorrhea none. She does not have intermenstrual bleeding.  She is single partner, contraception - Depo-Provera injections.  Last Pap: November 22, 2018  Results were: no abnormalities /neg HPV DNA negative Hx of STDs: none  There is no FH of breast cancer. There is no FH of ovarian cancer. The patient does do self-breast exams.  Tobacco use: She uses smokeless tobacco. She vapes Alcohol use: social drinker Exercise: moderately active  She consumes MJ daily. Last use: yesterday.  The patient wears seatbelts: yes.   The patient reports that domestic violence in her life is absent.   Past Medical History:  Diagnosis Date  . ADHD (attention deficit hyperactivity disorder)   . Depression   . Family history of breast cancer 08/2017   qualifies for updated genetic testing, may wait till older    Past Surgical History:  Procedure Laterality Date  . FOOT SURGERY Left 09/2004   compound fracture of left foot-had open reduction and a second surgery to remove hardware.  . WISDOM TOOTH EXTRACTION  age 19    Prior to Admission medications   Medication Sig Start Date End Date Taking? Authorizing Provider  medroxyPROGESTERone (DEPO-PROVERA) 150 MG/ML injection Inject 1 mL (150 mg total) into the muscle every 3 (three) months. 12/13/19  Yes Dalia Heading, CNM    No Known Allergies  Gynecologic History: No LMP recorded. Patient has had an injection. History of abnormal pap smear: No History of STI: No   Obstetric History: G0P0000  Social History   Socioeconomic History  . Marital status:  Single    Spouse name: Not on file  . Number of children: 0  . Years of education: Not on file  . Highest education level: Not on file  Occupational History  . Not on file  Tobacco Use  . Smoking status: Former Research scientist (life sciences)  . Smokeless tobacco: Never Used  Vaping Use  . Vaping Use: Some days  Substance and Sexual Activity  . Alcohol use: Yes    Comment: 3 x per week  . Drug use: Never  . Sexual activity: Yes    Birth control/protection: Injection  Other Topics Concern  . Not on file  Social History Narrative  . Not on file   Social Determinants of Health   Financial Resource Strain:   . Difficulty of Paying Living Expenses: Not on file  Food Insecurity:   . Worried About Charity fundraiser in the Last Year: Not on file  . Ran Out of Food in the Last Year: Not on file  Transportation Needs:   . Lack of Transportation (Medical): Not on file  . Lack of Transportation (Non-Medical): Not on file  Physical Activity:   . Days of Exercise per Week: Not on file  . Minutes of Exercise per Session: Not on file  Stress:   . Feeling of Stress : Not on file  Social Connections:   . Frequency of Communication with Friends and Family: Not on file  . Frequency of Social Gatherings with Friends and Family: Not on file  .  Attends Religious Services: Not on file  . Active Member of Clubs or Organizations: Not on file  . Attends Archivist Meetings: Not on file  . Marital Status: Not on file  Intimate Partner Violence:   . Fear of Current or Ex-Partner: Not on file  . Emotionally Abused: Not on file  . Physically Abused: Not on file  . Sexually Abused: Not on file    Family History  Problem Relation Age of Onset  . Anxiety disorder Mother   . Other Maternal Grandmother        acquired absence of kidney  . Breast cancer Maternal Grandmother 41       BRCA negative not on tamoxifen after unknown estrogen  . Hypertension Maternal Grandmother   . Kidney failure Other      Review of Systems  Constitutional: Positive for malaise/fatigue.  HENT: Negative.   Eyes: Negative.   Respiratory: Negative.   Cardiovascular: Negative.   Genitourinary: Negative.   Musculoskeletal: Negative.   Skin: Negative.   Neurological: Negative.   Endo/Heme/Allergies: Negative.   Psychiatric/Behavioral: The patient is nervous/anxious.      Physical Exam BP 112/70   Pulse 69   Resp 16   Ht 5' 5" (1.651 m)   Wt 122 lb 12.8 oz (55.7 kg)   SpO2 98%   BMI 20.43 kg/m    Physical Exam Constitutional:      Appearance: Normal appearance.  Genitourinary:     Vulva normal.  HENT:     Head: Normocephalic and atraumatic.  Eyes:     Extraocular Movements: Extraocular movements intact.     Pupils: Pupils are equal, round, and reactive to light.  Cardiovascular:     Rate and Rhythm: Normal rate and regular rhythm.     Pulses: Normal pulses.     Heart sounds: Normal heart sounds.  Pulmonary:     Effort: Pulmonary effort is normal.     Breath sounds: Normal breath sounds.  Abdominal:     General: Abdomen is flat. Bowel sounds are normal.     Palpations: Abdomen is soft.  Musculoskeletal:        General: Normal range of motion.     Cervical back: Normal range of motion and neck supple.  Neurological:     Mental Status: She is alert and oriented to person, place, and time.  Skin:    General: Skin is warm and dry.  Psychiatric:        Mood and Affect: Mood normal.        Behavior: Behavior normal.     Female chaperone present for pelvic and breast  portions of the physical exam  Results: AUDIT Questionnaire (screen for alcoholism): NA     Assessment: 24 y.o. G0P0000 female here for routine annual gynecologic examination. Also c/o ongoing fatigue and some hx of anxiety. She is scheduled to see a psyciatrist soon to address this and her hs of ADD.  Plan: Problem List Items Addressed This Visit    None    Visit Diagnoses    Women's annual routine  gynecological examination    -  Primary      Screening: -- Blood pressure screen normal -- Weight screening: normal -- Depression screening negative (PHQ-9) -- Nutrition: normal -- cholesterol screening: not due for screening -- osteoporosis screening: not due -- tobacco screening: using: discussed quitting using the 5 A's -- alcohol screening: AUDIT questionnaire indicates low-risk usage. -- family history of breast cancer screening: done. not at  high risk. -- no evidence of domestic violence or intimate partner violence. -- STD screening: gonorrhea/chlamydia NAAT not collected per patient request. -- pap smear collected per ASCCP guidelines -- flu vaccine declines today. -- HPV vaccination series: received  Blood draw today to check her CMP, CBC and TSH as possible causes of hr fatigue. Encouraged to consider stopping the self medication with MJ and discuss with her psychiatrist. RTC 1 year. Contineu Depo shots  Imagene Riches, CNM  01/31/2020 10:05 AM   01/31/2020 9:01 AM

## 2020-02-01 LAB — COMPREHENSIVE METABOLIC PANEL
ALT: 14 IU/L (ref 0–32)
AST: 20 IU/L (ref 0–40)
Albumin/Globulin Ratio: 2 (ref 1.2–2.2)
Albumin: 4.6 g/dL (ref 3.9–5.0)
Alkaline Phosphatase: 56 IU/L (ref 44–121)
BUN/Creatinine Ratio: 14 (ref 9–23)
BUN: 14 mg/dL (ref 6–20)
Bilirubin Total: 0.3 mg/dL (ref 0.0–1.2)
CO2: 22 mmol/L (ref 20–29)
Calcium: 9.2 mg/dL (ref 8.7–10.2)
Chloride: 103 mmol/L (ref 96–106)
Creatinine, Ser: 1.02 mg/dL — ABNORMAL HIGH (ref 0.57–1.00)
GFR calc Af Amer: 88 mL/min/{1.73_m2} (ref >59)
GFR calc non Af Amer: 77 mL/min/{1.73_m2} (ref >59)
Globulin, Total: 2.3 g/dL (ref 1.5–4.5)
Glucose: 81 mg/dL (ref 65–99)
Potassium: 4.3 mmol/L (ref 3.5–5.2)
Sodium: 138 mmol/L (ref 134–144)
Total Protein: 6.9 g/dL (ref 6.0–8.5)

## 2020-02-01 LAB — CBC WITH DIFFERENTIAL
Basophils Absolute: 0.2 10*3/uL (ref 0.0–0.2)
Basos: 2 %
EOS (ABSOLUTE): 2.1 10*3/uL — ABNORMAL HIGH (ref 0.0–0.4)
Eos: 23 %
Hematocrit: 39.4 % (ref 34.0–46.6)
Hemoglobin: 13.5 g/dL (ref 11.1–15.9)
Immature Grans (Abs): 0 10*3/uL (ref 0.0–0.1)
Immature Granulocytes: 0 %
Lymphocytes Absolute: 2.3 10*3/uL (ref 0.7–3.1)
Lymphs: 26 %
MCH: 30.1 pg (ref 26.6–33.0)
MCHC: 34.3 g/dL (ref 31.5–35.7)
MCV: 88 fL (ref 79–97)
Monocytes Absolute: 0.6 10*3/uL (ref 0.1–0.9)
Monocytes: 6 %
Neutrophils Absolute: 3.8 10*3/uL (ref 1.4–7.0)
Neutrophils: 43 %
RBC: 4.49 x10E6/uL (ref 3.77–5.28)
RDW: 13.3 % (ref 11.7–15.4)
WBC: 9 10*3/uL (ref 3.4–10.8)

## 2020-02-01 LAB — TSH: TSH: 1.6 u[IU]/mL (ref 0.450–4.500)

## 2020-02-05 ENCOUNTER — Other Ambulatory Visit: Payer: Self-pay | Admitting: Obstetrics

## 2020-02-05 DIAGNOSIS — R87612 Low grade squamous intraepithelial lesion on cytologic smear of cervix (LGSIL): Secondary | ICD-10-CM

## 2020-02-05 DIAGNOSIS — Z124 Encounter for screening for malignant neoplasm of cervix: Secondary | ICD-10-CM

## 2020-02-05 LAB — CYTOLOGY - PAP

## 2020-02-05 NOTE — Progress Notes (Signed)
Called the patient to share her pap smear results. She is going to call the office to set upa colposcopy appointment. Her pap was LGSIL. Mirna Mires, CNM  02/05/2020 11:20 AM

## 2020-02-19 ENCOUNTER — Ambulatory Visit: Payer: Medicaid Other | Attending: Oncology

## 2020-02-19 DIAGNOSIS — R87612 Low grade squamous intraepithelial lesion on cytologic smear of cervix (LGSIL): Secondary | ICD-10-CM

## 2020-02-26 ENCOUNTER — Ambulatory Visit (INDEPENDENT_AMBULATORY_CARE_PROVIDER_SITE_OTHER): Payer: Self-pay | Admitting: Obstetrics and Gynecology

## 2020-02-26 ENCOUNTER — Other Ambulatory Visit (HOSPITAL_COMMUNITY)
Admission: RE | Admit: 2020-02-26 | Discharge: 2020-02-26 | Disposition: A | Discharge status: Home / Self Care | Payer: Medicaid Other | Source: Ambulatory Visit | Attending: Obstetrics and Gynecology | Admitting: Obstetrics and Gynecology

## 2020-02-26 ENCOUNTER — Other Ambulatory Visit: Payer: Self-pay

## 2020-02-26 ENCOUNTER — Encounter: Payer: Self-pay | Admitting: Obstetrics and Gynecology

## 2020-02-26 VITALS — BP 112/70 | HR 68 | Resp 16 | Ht 65.0 in | Wt 123.6 lb

## 2020-02-26 DIAGNOSIS — R87612 Low grade squamous intraepithelial lesion on cytologic smear of cervix (LGSIL): Secondary | ICD-10-CM | POA: Diagnosis not present

## 2020-02-26 NOTE — Progress Notes (Signed)
   GYNECOLOGY CLINIC COLPOSCOPY PROCEDURE NOTE  24 y.o. G0P0000 here for colposcopy for low-grade squamous intraepithelial neoplasia (LGSIL - encompassing HPV,mild dysplasia,CIN I)  pap smear on 01/31/2020. Discussed underlying role for HPV infection in the development of cervical dysplasia, its natural history and progression/regression, need for surveillance.  Is the patient  pregnant: No LMP: No LMP recorded. Patient has had an injection. Smoking status:  reports that she has quit smoking. She has never used smokeless tobacco. Contraception: Depo-Provera injections Future fertility desired:  Yes  Patient given informed consent, signed copy in the chart, time out was performed.  The patient was position in dorsal lithotomy position. Speculum was placed the cervix was visualized.   After application of acetic acid colposcopic inspection of the cervix was undertaken.   Colposcopy adequate, full visualization of transformation zone: Yes acetowhite lesion(s) noted at 10 and 5 o'clock; corresponding biopsies obtained.   ECC specimen obtained:  Yes  All specimens were labeled and sent to pathology.   Patient was given post procedure instructions.  Will follow up pathology and manage accordingly.  Routine preventative health maintenance measures emphasized.  Physical Exam Genitourinary:        Genitourinary Comments: Highlighting with lugols and acetowhite changes     Adelene Idler MD [facility], [facility] 02/26/2020 8:29 AM

## 2020-02-26 NOTE — Patient Instructions (Signed)
Colposcopy, Care After This sheet gives you information about how to care for yourself after your procedure. Your doctor may also give you more specific instructions. If you have problems or questions, contact your doctor. What can I expect after the procedure? If you did not have a tissue sample removed (did not have a biopsy), you may only have some spotting for a few days. You can go back to your normal activities. If you had a tissue sample removed, it is common to have:  Soreness and pain. This may last for a few days.  Light-headedness.  Mild bleeding from your vagina or dark-colored, grainy discharge from your vagina. This may last for a few days. You may need to wear a sanitary pad.  Spotting for at least 48 hours after the procedure. Follow these instructions at home:   Take over-the-counter and prescription medicines only as told by your doctor. Ask your doctor what medicines you can start taking again. This is very important if you take blood-thinning medicine.  Do not drive or use heavy machinery while taking prescription pain medicine.  For 3 days, or as long as your doctor tells you, avoid: ? Douching. ? Using tampons. ? Having sex.  If you use birth control (contraception), keep using it.  Limit activity for the first day after the procedure. Ask your doctor what activities are safe for you.  It is up to you to get the results of your procedure. Ask your doctor when your results will be ready.  Keep all follow-up visits as told by your doctor. This is important. Contact a doctor if:  You get a skin rash. Get help right away if:  You are bleeding a lot from your vagina. It is a lot of bleeding if you are using more than one pad an hour for 2 hours in a row.  You have clumps of blood (blood clots) coming from your vagina.  You have a fever.  You have chills  You have pain in your lower belly (pelvic area).  You have signs of infection, such as vaginal  discharge that is: ? Different than usual. ? Yellow. ? Bad-smelling.  You have very pain or cramps in your lower belly that do not get better with medicine.  You feel light-headed.  You feel dizzy.  You pass out (faint). Summary  If you did not have a tissue sample removed (did not have a biopsy), you may only have some spotting for a few days. You can go back to your normal activities.  If you had a tissue sample removed, it is common to have mild pain and spotting for 48 hours.  For 3 days, or as long as your doctor tells you, avoid douching, using tampons and having sex.  Get help right away if you have bleeding, very bad pain, or signs of infection. This information is not intended to replace advice given to you by your health care provider. Make sure you discuss any questions you have with your health care provider. Document Revised: 04/15/2017 Document Reviewed: 01/21/2016 Elsevier Patient Education  2020 Elsevier Inc.  

## 2020-02-27 ENCOUNTER — Other Ambulatory Visit: Payer: Self-pay | Admitting: Obstetrics and Gynecology

## 2020-02-27 DIAGNOSIS — F419 Anxiety disorder, unspecified: Secondary | ICD-10-CM

## 2020-02-27 LAB — SURGICAL PATHOLOGY

## 2020-02-27 MED ORDER — DIAZEPAM 2 MG PO TABS: 2.0000 mg | ORAL_TABLET | Freq: Once | ORAL | 0 refills | Status: AC

## 2020-02-27 NOTE — Progress Notes (Signed)
Please schedule patient for LEEP

## 2020-02-29 ENCOUNTER — Telehealth: Payer: Self-pay | Admitting: Obstetrics and Gynecology

## 2020-02-29 NOTE — Telephone Encounter (Signed)
Called pt to schedule LEEP in office with Schuman  11/23 at 9:30am adv pt to arrive at 9:15

## 2020-03-03 NOTE — Telephone Encounter (Signed)
Noted  

## 2020-03-10 ENCOUNTER — Ambulatory Visit: Payer: Self-pay

## 2020-03-12 ENCOUNTER — Ambulatory Visit: Payer: Self-pay

## 2020-03-13 ENCOUNTER — Ambulatory Visit: Payer: Self-pay

## 2020-03-14 ENCOUNTER — Other Ambulatory Visit: Payer: Self-pay

## 2020-03-14 ENCOUNTER — Ambulatory Visit: Payer: Self-pay

## 2020-03-14 DIAGNOSIS — Z3042 Encounter for surveillance of injectable contraceptive: Secondary | ICD-10-CM

## 2020-03-14 MED ORDER — MEDROXYPROGESTERONE ACETATE 150 MG/ML IM SUSP: 150.0000 mg | INTRAMUSCULAR | 3 refills | Status: DC

## 2020-03-14 NOTE — Telephone Encounter (Signed)
Patient needs refill of Depo Provera. [phone]

## 2020-03-14 NOTE — Telephone Encounter (Signed)
Chart reviewed. Patient had AE w/[facility] 01/31/20, No rx sent that day. Refill sent today for full year. Pt aware.

## 2020-03-17 ENCOUNTER — Other Ambulatory Visit: Payer: Self-pay

## 2020-03-17 ENCOUNTER — Ambulatory Visit (INDEPENDENT_AMBULATORY_CARE_PROVIDER_SITE_OTHER): Payer: Medicaid Other

## 2020-03-17 DIAGNOSIS — Z3042 Encounter for surveillance of injectable contraceptive: Secondary | ICD-10-CM | POA: Diagnosis not present

## 2020-03-17 MED ORDER — MEDROXYPROGESTERONE ACETATE 150 MG/ML IM SUSP
150.0000 mg | Freq: Once | INTRAMUSCULAR | Status: AC
  Administered 2020-03-17: 150 mg via INTRAMUSCULAR

## 2020-03-17 NOTE — Progress Notes (Signed)
Pt here for depo which was given IM right glut.  NDC# 66993-370-83 

## 2020-04-08 ENCOUNTER — Ambulatory Visit (INDEPENDENT_AMBULATORY_CARE_PROVIDER_SITE_OTHER): Payer: Self-pay | Admitting: Obstetrics and Gynecology

## 2020-04-08 ENCOUNTER — Encounter: Payer: Self-pay | Admitting: Obstetrics and Gynecology

## 2020-04-08 ENCOUNTER — Other Ambulatory Visit (HOSPITAL_COMMUNITY)
Admission: RE | Admit: 2020-04-08 | Discharge: 2020-04-08 | Disposition: A | Discharge status: Home / Self Care | Payer: Medicaid Other | Source: Ambulatory Visit | Attending: Obstetrics and Gynecology | Admitting: Obstetrics and Gynecology

## 2020-04-08 ENCOUNTER — Other Ambulatory Visit: Payer: Self-pay

## 2020-04-08 VITALS — BP 110/70 | Ht 65.0 in | Wt 122.8 lb

## 2020-04-08 DIAGNOSIS — N871 Moderate cervical dysplasia: Secondary | ICD-10-CM

## 2020-04-08 NOTE — Progress Notes (Signed)
Procedure LEEP

## 2020-04-08 NOTE — Progress Notes (Signed)
  LEEP PROCEDURE NOTE  The LEEP has been explained to the patient in detail; risks/benefits reviewed.  The risks include, but are not limited to, bleeding, infection, and the possibility of cervical stenosis or cervical incompetence.  The patient had previously been given information regarding abnormal PAP smears and their relationship to HPV.  We have discussed the natural course and history of HPV, the possibility of incomplete treatment by the LEEP, as well as the possibility of recurrence.  I have reviewed the consent form for LEEP with her, and she fully understands its contents.  We have discussed the procedure itself. I have informed her that following the LEEP she should refrain from intercourse and the use of tampons for three weeks, and that she should also expect some spotting and brown/black discharge over the next several days.  We have discussed the fact that vaginal bleeding, differentiated from spotting, is not normal and that if she should have this complication, she should contact me immediately.  The follow-up after LEEP will be PAP smears or viral typing performed at regular intervals for up to 3-5 years.  Should these all prove to be normal, she will then be back on typical cervical screening.  I have answered all of her questions, and I believe she has an adequate understanding of the LEEP, its implications, and the necessity of follow-up care.  I discussed her colpo results and explained the procedure of LEEP.  All questions were answered and she signed the consent form.    LEEP performed in the usual manner after reviewing the previous colpo findings and results. Acetic acid solution was usesd to identify any abnormal areas of the cervix.  The cervix was cleansed with betadine solution. Local injection of Lidocaine was performed for anesthesia. Ectocervical specimens obtained using the loop electrodes without difficulty.  It was labeled accordingly. Post LEEP ECC performed The  base and edges of the defect was then cauterized using coagulation current.   Adelene Idler MD, [facility], [facility] 04/08/2020 10:19 AM

## 2020-04-08 NOTE — Patient Instructions (Signed)
LEEP POST-PROCEDURE INSTRUCTIONS  1. You may take Ibuprofen, Aleve or Tylenol for pain if needed.  Cramping is normal.  2. You will have black and/or bloody discharge at first.  This will lighten and then turn clear before completely resolving.  This will take 2 to 3 weeks.  3. Put nothing in your vagina until the bleeding or discharge stops (usually 2 or3 days).  4. You need to call if you have redness around the biopsy site, if there is any unusual draining, if the bleeding is heavy, or if you are concerned.  5. Shower or bathe as normal  6. We will call you within one week with results or we will discuss the results at your follow-up appointment if needed.  You will need to return for a follow-up Pap smear as directed by your physician.Loop Electrosurgical Excision Procedure Loop electrosurgical excision procedure (LEEP) is the cutting and removal (excision) of tissue from the cervix. The cervix is the bottom part of the uterus that opens into the vagina. The tissue that is removed from the cervix is examined to see if there are precancerous cells or cancer cells present. LEEP may be done when:  You have abnormal bleeding from your cervix.  You have an abnormal Pap test result.  Your health care provider finds an abnormality on your cervix during a pelvic exam. LEEP typically only takes a few minutes and is often done in the health care provider's office. The procedure is safe for women who are trying to get pregnant. However, the procedure is usually not done during a menstrual period or during pregnancy. Tell a health care provider about:  Any allergies you have.  All medicines you are taking, including vitamins, herbs, eye drops, creams, and over-the-counter medicines.  Any blood disorders you have.  Any medical conditions you have, including current or past vaginal infections such as herpes or sexually-transmitted infections (STIs).  Whether you are pregnant or may be  pregnant.  Whether or not you are having vaginal bleeding on the day of the procedure. What are the risks? Generally, this is a safe procedure. However, problems may occur, including:  Infection.  Bleeding.  Allergic reactions to medicines.  Changes or scarring in the cervix.  Increased risk of early (preterm) labor in future pregnancies. What happens before the procedure?  Ask your health care provider about: ? Changing or stopping your regular medicines. This is especially important if you are taking diabetes medicines or blood thinners. ? Taking medicines such as aspirin and ibuprofen. These medicines can thin your blood. Do not take these medicines unless your health care provider tells you to take them. ? Taking over-the-counter medicines, vitamins, herbs, and supplements.  Your health care provider may recommend that you take pain medicine before the procedure.  Ask your health care provider if you should plan to have someone take you home after the procedure. What happens during the procedure?   An instrument called a speculum will be placed in your vagina. This will allow your health care provider to see your cervix.  You will be given a medicine to numb the area (local anesthetic). The medicine will be injected into your cervix and the surrounding area.  A solution will be applied to your cervix. This solution will help the health care provider find the abnormal cells that need to be removed.  A thin wire loop will be passed through your vagina. The wire will be used to burn (cauterize) the cervical tissue with an   electrical current.  You may feel faint during the procedure. Tell your health care provider right away if you feel this way.  The abnormal cervical tissue will be removed.  Any open blood vessels will be cauterized to prevent bleeding.  A paste may be applied to the cauterized area of your cervix to help prevent bleeding.  The sample of cervical tissue  will be examined under a microscope. The procedure may vary among health care providers and hospitals. What can I expect after the procedure? After the procedure, it is common to have:  Mild abdominal cramps that are similar to menstrual cramps. These may last for up to 1 week.  A small amount of pink-tinged or bloody vaginal discharge, including light to moderate bleeding, for 1-2 weeks.  A dark-colored discharge coming from your vagina. This is from the paste that was used on the cervix to prevent bleeding. It is up to you to get the results of your procedure. Ask your health care provider, or the department that is doing the procedure, when your results will be ready. Follow these instructions at home:  Take over-the-counter and prescription medicines only as told by your health care provider.  Return to your normal activities as told by your health care provider. Ask your health care provider what activities are safe for you.  Do not put anything in your vagina for 2 weeks after the procedure or until your health care provider says that it is okay. This includes tampons, creams, and douches.  Do not have sex until your health care provider approves.  Keep all follow-up visits as told by your health care provider. This is important. Contact a health care provider if you:  Have a fever or chills.  Feel unusually weak.  Have vaginal bleeding that is heavier or longer than a normal menstrual cycle. A sign of this can be soaking a pad with blood or bleeding with clots.  Develop a bad smelling vaginal discharge.  Have severe abdominal pain or cramping. Summary  Loop electrosurgical excision procedure (LEEP) is the removal of tissue from the cervix. The removed tissue will be checked for precancerous cells or cancer cells.  LEEP typically only takes a few minutes and is often done in the health care provider's office.  Do not put anything in your vagina for 2 weeks after the  procedure or until your health care provider says that it is okay. This includes tampons, creams, and douches.  Keep all follow-up visits as told by your health care provider. Ask your health care provider, or the department that is doing the procedure, when your results will be ready. This information is not intended to replace advice given to you by your health care provider. Make sure you discuss any questions you have with your health care provider. Document Revised: 05/26/2018 Document Reviewed: 05/26/2018 Elsevier Patient Education  2020 Elsevier Inc.  

## 2020-04-14 LAB — SURGICAL PATHOLOGY

## 2020-05-08 ENCOUNTER — Ambulatory Visit: Payer: Medicaid Other | Admitting: Obstetrics and Gynecology

## 2020-05-19 ENCOUNTER — Ambulatory Visit: Payer: Medicaid Other | Admitting: Obstetrics and Gynecology

## 2020-05-22 ENCOUNTER — Ambulatory Visit: Payer: Medicaid Other | Admitting: Obstetrics and Gynecology

## 2020-05-26 ENCOUNTER — Ambulatory Visit: Payer: Medicaid Other | Admitting: Obstetrics and Gynecology

## 2020-06-05 ENCOUNTER — Ambulatory Visit: Payer: Medicaid Other | Admitting: Obstetrics and Gynecology

## 2020-06-10 ENCOUNTER — Ambulatory Visit: Payer: Medicaid Other

## 2020-06-11 ENCOUNTER — Other Ambulatory Visit: Payer: Self-pay

## 2020-06-11 ENCOUNTER — Ambulatory Visit (INDEPENDENT_AMBULATORY_CARE_PROVIDER_SITE_OTHER): Payer: Medicaid Other

## 2020-06-11 DIAGNOSIS — Z3042 Encounter for surveillance of injectable contraceptive: Secondary | ICD-10-CM

## 2020-06-11 MED ORDER — MEDROXYPROGESTERONE ACETATE 150 MG/ML IM SUSP
150.0000 mg | Freq: Once | INTRAMUSCULAR | Status: AC
  Administered 2020-06-11: 150 mg via INTRAMUSCULAR

## 2020-06-23 ENCOUNTER — Ambulatory Visit: Payer: Medicaid Other | Admitting: Obstetrics and Gynecology

## 2020-06-30 ENCOUNTER — Other Ambulatory Visit: Payer: Self-pay

## 2020-06-30 ENCOUNTER — Ambulatory Visit (INDEPENDENT_AMBULATORY_CARE_PROVIDER_SITE_OTHER): Payer: Medicaid Other | Admitting: Obstetrics and Gynecology

## 2020-06-30 ENCOUNTER — Encounter: Payer: Self-pay | Admitting: Obstetrics and Gynecology

## 2020-06-30 VITALS — BP 118/70 | Ht 65.0 in | Wt 121.0 lb

## 2020-06-30 DIAGNOSIS — Z09 Encounter for follow-up examination after completed treatment for conditions other than malignant neoplasm: Secondary | ICD-10-CM

## 2020-06-30 DIAGNOSIS — Z9889 Other specified postprocedural states: Secondary | ICD-10-CM

## 2020-06-30 NOTE — Progress Notes (Signed)
Follow up visit from 04/08/20

## 2020-06-30 NOTE — Progress Notes (Signed)
Patient ID: Debbie Coleman, female   DOB: [birth year], 25 y.o.   MRN: [id number]  Reason for Consult: Gynecologic Exam   Referred by Kirk Ruths, MD  Subjective:     HPI:  Debbie Coleman is a 25 y.o. female. She is following up after her LEEP. She is on Depo, but reported 2 periods after the LEEP. Bleeding has since subsided. She reports anxiety, but is iis actively working with a psychiatrist to help manage this condition.    Past Medical History:  Diagnosis Date  . ADHD (attention deficit hyperactivity disorder)   . Depression   . Family history of breast cancer 08/2017   qualifies for updated genetic testing, may wait till older   Family History  Problem Relation Age of Onset  . Anxiety disorder Mother   . Other Maternal Grandmother        acquired absence of kidney  . Breast cancer Maternal Grandmother 25       BRCA negative not on tamoxifen after unknown estrogen  . Hypertension Maternal Grandmother   . Kidney failure Other    Past Surgical History:  Procedure Laterality Date  . FOOT SURGERY Left 09/2004   compound fracture of left foot-had open reduction and a second surgery to remove hardware.  . WISDOM TOOTH EXTRACTION  age 63    Short Social History:  Social History   Tobacco Use  . Smoking status: Former Research scientist (life sciences)  . Smokeless tobacco: Never Used  Substance Use Topics  . Alcohol use: Yes    Comment: 3 x per week    No Known Allergies  Current Outpatient Medications  Medication Sig Dispense Refill  . buPROPion (WELLBUTRIN XL) 150 MG 24 hr tablet Take 150 mg by mouth daily.    . medroxyPROGESTERone (DEPO-PROVERA) 150 MG/ML injection Inject 1 mL (150 mg total) into the muscle every 3 (three) months. 1 mL 3  . propranolol (INDERAL) 10 MG tablet Take 30 mg by mouth 3 (three) times daily.     No current facility-administered medications for this visit.    Review of Systems  Constitutional: Negative for chills, fatigue, fever and  unexpected weight change.  HENT: Negative for trouble swallowing.  Eyes: Negative for loss of vision.  Respiratory: Negative for cough, shortness of breath and wheezing.  Cardiovascular: Negative for chest pain, leg swelling, palpitations and syncope.  GI: Negative for abdominal pain, blood in stool, diarrhea, nausea and vomiting.  GU: Negative for difficulty urinating, dysuria, frequency and hematuria.  Musculoskeletal: Negative for back pain, leg pain and joint pain.  Skin: Negative for rash.  Neurological: Negative for dizziness, headaches, light-headedness, numbness and seizures.  Psychiatric: Negative for behavioral problem, confusion, depressed mood and sleep disturbance.        Objective:  Objective   Vitals:   06/30/20 1010  BP: 118/70  Weight: 121 lb (54.9 kg)  Height: '5\' 5"'  (1.651 m)   Body mass index is 20.14 kg/m.  Physical Exam Vitals and nursing note reviewed.  Constitutional:      Appearance: She is well-developed and well-nourished.  HENT:     Head: Normocephalic and atraumatic.  Eyes:     Extraocular Movements: EOM normal.     Pupils: Pupils are equal, round, and reactive to light.  Cardiovascular:     Rate and Rhythm: Normal rate and regular rhythm.  Pulmonary:     Effort: Pulmonary effort is normal. No respiratory distress.  Genitourinary:    Comments: External: Normal appearing vulva. No  lesions noted.  Speculum examination: Normal appearing cervix. No blood in the vaginal vault. No discharge.  Well healed after LEEP Skin:    General: Skin is warm and dry.  Neurological:     Mental Status: She is alert and oriented to person, place, and time.  Psychiatric:        Mood and Affect: Mood and affect normal.        Behavior: Behavior normal.        Thought Content: Thought content normal.        Judgment: Judgment normal.     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 Well healed after LEEP Repeat  pap smear 1 year after colposcopy. November 2022  More than 20 minutes were spent face to face with the patient in the room, reviewing the medical record, labs and images, and coordinating care for the patient. The plan of management was discussed in detail and counseling was provided.      Adrian Prows MD [facility] 06/30/2020 10:36 AM

## 2020-06-30 NOTE — Patient Instructions (Signed)

## 2020-09-04 ENCOUNTER — Ambulatory Visit (INDEPENDENT_AMBULATORY_CARE_PROVIDER_SITE_OTHER): Payer: Medicaid Other

## 2020-09-04 ENCOUNTER — Other Ambulatory Visit: Payer: Self-pay

## 2020-09-04 DIAGNOSIS — Z3042 Encounter for surveillance of injectable contraceptive: Secondary | ICD-10-CM

## 2020-09-04 MED ORDER — MEDROXYPROGESTERONE ACETATE 150 MG/ML IM SUSP
150.0000 mg | Freq: Once | INTRAMUSCULAR | Status: AC
  Administered 2020-09-04: 150 mg via INTRAMUSCULAR

## 2020-09-04 NOTE — Progress Notes (Signed)
Patient presents today for Depo Provera injection within dates. Given IM LUOQ. Patient tolerated well. 

## 2020-11-27 ENCOUNTER — Ambulatory Visit (INDEPENDENT_AMBULATORY_CARE_PROVIDER_SITE_OTHER): Payer: Medicaid Other

## 2020-11-27 ENCOUNTER — Other Ambulatory Visit: Payer: Self-pay

## 2020-11-27 DIAGNOSIS — Z3042 Encounter for surveillance of injectable contraceptive: Secondary | ICD-10-CM

## 2020-11-27 MED ORDER — MEDROXYPROGESTERONE ACETATE 150 MG/ML IM SUSP
150.0000 mg | Freq: Once | INTRAMUSCULAR | Status: AC
  Administered 2020-11-27: 150 mg via INTRAMUSCULAR

## 2020-11-27 NOTE — Progress Notes (Signed)
Patient presents today for Depo Provera injection within dates. Given IM RUOQ. Patient tolerated well. 

## 2021-02-19 ENCOUNTER — Ambulatory Visit: Payer: Medicaid Other

## 2021-02-19 ENCOUNTER — Other Ambulatory Visit: Payer: Self-pay

## 2021-02-19 DIAGNOSIS — Z3042 Encounter for surveillance of injectable contraceptive: Secondary | ICD-10-CM

## 2021-02-19 MED ORDER — MEDROXYPROGESTERONE ACETATE 150 MG/ML IM SUSP: 150.0000 mg | INTRAMUSCULAR | 0 refills | Status: DC

## 2021-02-19 NOTE — Telephone Encounter (Signed)
Pt calling; depo refill not sent in; does she need an annual?  Appt tomorrow for depo.  [phone] pt aware refill eRx'd and she does need annual which she can schedule when she comes in tomorrow.  Appt note made as well to schedule annual.

## 2021-02-20 ENCOUNTER — Ambulatory Visit: Payer: Medicaid Other

## 2021-03-02 ENCOUNTER — Ambulatory Visit: Payer: Medicaid Other

## 2021-03-02 NOTE — Telephone Encounter (Signed)
This encounter was created in error - please disregard.

## 2021-03-13 ENCOUNTER — Ambulatory Visit (INDEPENDENT_AMBULATORY_CARE_PROVIDER_SITE_OTHER): Payer: Medicaid Other

## 2021-03-13 ENCOUNTER — Other Ambulatory Visit: Payer: Self-pay

## 2021-03-13 DIAGNOSIS — Z3202 Encounter for pregnancy test, result negative: Secondary | ICD-10-CM | POA: Diagnosis not present

## 2021-03-13 DIAGNOSIS — Z3042 Encounter for surveillance of injectable contraceptive: Secondary | ICD-10-CM

## 2021-03-13 LAB — POCT URINE PREGNANCY: Preg Test, Ur: NEGATIVE

## 2021-03-13 MED ORDER — MEDROXYPROGESTERONE ACETATE 150 MG/ML IM SUSP
150.0000 mg | Freq: Once | INTRAMUSCULAR | Status: AC
  Administered 2021-03-13: 150 mg via INTRAMUSCULAR

## 2021-03-13 NOTE — Progress Notes (Signed)
Pt here for depo which was given IM right glut after negative urine preg test.  Pt tolerated well.  NDC# [phone]

## 2021-03-19 ENCOUNTER — Ambulatory Visit: Payer: Medicaid Other

## 2021-06-09 ENCOUNTER — Ambulatory Visit: Payer: Medicaid Other

## 2021-06-09 ENCOUNTER — Encounter: Payer: Self-pay | Admitting: Licensed Practical Nurse

## 2021-06-09 ENCOUNTER — Other Ambulatory Visit (HOSPITAL_COMMUNITY)
Admission: RE | Admit: 2021-06-09 | Discharge: 2021-06-09 | Disposition: A | Discharge status: Home / Self Care | Payer: Medicaid Other | Source: Ambulatory Visit | Attending: Licensed Practical Nurse | Admitting: Licensed Practical Nurse

## 2021-06-09 ENCOUNTER — Other Ambulatory Visit: Payer: Self-pay

## 2021-06-09 ENCOUNTER — Ambulatory Visit (INDEPENDENT_AMBULATORY_CARE_PROVIDER_SITE_OTHER): Payer: Medicaid Other | Admitting: Licensed Practical Nurse

## 2021-06-09 VITALS — BP 122/74 | Ht 65.0 in | Wt 126.0 lb

## 2021-06-09 DIAGNOSIS — A5402 Gonococcal vulvovaginitis, unspecified: Secondary | ICD-10-CM | POA: Diagnosis not present

## 2021-06-09 DIAGNOSIS — Z3042 Encounter for surveillance of injectable contraceptive: Secondary | ICD-10-CM

## 2021-06-09 DIAGNOSIS — Z113 Encounter for screening for infections with a predominantly sexual mode of transmission: Secondary | ICD-10-CM | POA: Diagnosis not present

## 2021-06-09 DIAGNOSIS — Z0000 Encounter for general adult medical examination without abnormal findings: Secondary | ICD-10-CM | POA: Diagnosis not present

## 2021-06-09 DIAGNOSIS — Z3202 Encounter for pregnancy test, result negative: Secondary | ICD-10-CM

## 2021-06-09 DIAGNOSIS — Z01419 Encounter for gynecological examination (general) (routine) without abnormal findings: Secondary | ICD-10-CM

## 2021-06-09 DIAGNOSIS — Z124 Encounter for screening for malignant neoplasm of cervix: Secondary | ICD-10-CM

## 2021-06-09 LAB — POCT URINE PREGNANCY: Preg Test, Ur: NEGATIVE

## 2021-06-09 MED ORDER — MEDROXYPROGESTERONE ACETATE 150 MG/ML IM SUSP: 150.0000 mg | INTRAMUSCULAR | 3 refills | Status: DC

## 2021-06-09 NOTE — Progress Notes (Signed)
Gynecology Annual Exam  PCP: Kirk Ruths, MD  Chief Complaint:  Chief Complaint  Patient presents with   Annual Exam    History of Present Illness: Patient is a 26 y.o. G0P0000 presents for annual exam. The patient reports having breast tenderness today-usually near around cycles, but the tenderness seems to persist-has started wearing new bras that are a little small. . Reports she no longer has insurance so has not been able to keep up with ADD medications or primary care exams.   LMP: No LMP recorded. Patient has had an injection.  Average Interval: irregular, not applicable days Duration of flow: 1 days Heavy Menses: no Clots: no Intermenstrual Bleeding: no Postcoital Bleeding: no Dysmenorrhea: no  The patient is sexually active. 1 female partner in the last 3 months.  She currently uses Depo-Provera injections for contraception. Does not use condoms. She denies dyspareunia.  The patient does perform self breast exams.  There is no notable family history of breast or ovarian cancer in her family.  The patient wears seatbelts: yes.  The patient has regular exercise:  is active for work  . 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 Lives with her best friend and friend's boyfriend, feels safe where she lives Occasional smokes cigarettes, drinks 4 cocktail at a time 3-4 times a week, does use MJ   The patient denies current symptoms of depression.    Review of Systems: Review of Systems  Constitutional: Negative.   HENT: Negative.    Respiratory: Negative.    Cardiovascular: Negative.   Gastrointestinal: Negative.   Genitourinary: Negative.   Skin: Negative.   Neurological: Negative.   Psychiatric/Behavioral: Negative.     Past Medical History:  Patient Active Problem List   Diagnosis Date Noted   ADD (attention deficit disorder) 01/31/2020   Foot fracture, left 01/31/2020   Migraines 01/31/2020   Contraception  management 11/22/2018   Chronic low back pain 03/02/2016   Depression with anxiety 09/28/2014   Bladder pain 03/05/2013   Chronic cystitis 03/05/2013   Increased frequency of urination 03/05/2013   Vesicoureteral reflux, unilateral 03/05/2013    Past Surgical History:  Past Surgical History:  Procedure Laterality Date   FOOT SURGERY Left 09/2004   compound fracture of left foot-had open reduction and a second surgery to remove hardware.   WISDOM TOOTH EXTRACTION  age 17    Gynecologic History:  No LMP recorded. Patient has had an injection. Contraception: Depo-Provera injections Last Pap: Results were:  low-grade squamous intraepithelial neoplasia (LGSIL - encompassing HPV,mild dysplasia,CIN I) had LEEP 04/08/2020  Obstetric History: G0P0000  Family History:  Family History  Problem Relation Age of Onset   Anxiety disorder Mother    Other Maternal Grandmother        acquired absence of kidney   Breast cancer Maternal Grandmother 40       BRCA negative not on tamoxifen after unknown estrogen   Hypertension Maternal Grandmother    Kidney failure Other     Social History:  Social History   Socioeconomic History   Marital status: Single    Spouse name: Not on file   Number of children: 0   Years of education: Not on file   Highest education level: Not on file  Occupational History   Not on file  Tobacco Use   Smoking status: Former   Smokeless tobacco: Never  Vaping Use   Vaping Use: Some  days  Substance and Sexual Activity   Alcohol use: Yes    Comment: 3 x per week   Drug use: Never   Sexual activity: Yes    Birth control/protection: Injection  Other Topics Concern   Not on file  Social History Narrative   Not on file   Social Determinants of Health   Financial Resource Strain: Not on file  Food Insecurity: Not on file  Transportation Needs: Not on file  Physical Activity: Not on file  Stress: Not on file  Social Connections: Not on file  Intimate  Partner Violence: Not on file    Allergies:  No Known Allergies  Medications: Prior to Admission medications   Medication Sig Start Date End Date Taking? Authorizing Provider  medroxyPROGESTERone (DEPO-PROVERA) 150 MG/ML injection Inject 1 mL (150 mg total) into the muscle every 3 (three) months. 02/19/21  Yes Schuman, Christanna R, MD  buPROPion (WELLBUTRIN XL) 150 MG 24 hr tablet Take 150 mg by mouth daily. Patient not taking: Reported on 06/09/2021    [provider]  propranolol (INDERAL) 10 MG tablet Take 30 mg by mouth 3 (three) times daily. Patient not taking: Reported on 06/09/2021    [provider]    Physical Exam Vitals: Blood pressure 122/74, height _0  (1.651 m), weight 126 lb (57.2 kg).  General: NAD HEENT: normocephalic, anicteric Thyroid: no enlargement, no palpable nodules Pulmonary: No increased work of breathing, CTAB Cardiovascular: RRR, distal pulses 2+ Breast: Breast symmetrical, no tenderness, no palpable nodules or masses, no skin or nipple retraction present, no nipple discharge.  No axillary or supraclavicular lymphadenopathy. Abdomen: NABS, soft, non-tender, non-distended.  Umbilicus without lesions.  No hepatomegaly, splenomegaly or masses palpable. No evidence of hernia  Genitourinary:  External: Normal external female genitalia.  Normal urethral meatus, normal Bartholin's and Skene's glands.    Vagina: Normal vaginal mucosa, no evidence of prolapse.    Cervix: Grossly normal in appearance, no bleeding  Uterus: Non-enlarged, mobile, normal contour.  No CMT  Adnexa: ovaries non-enlarged, no adnexal masses  Rectal: deferred  Lymphatic: no evidence of inguinal lymphadenopathy Extremities: no edema, erythema, or tenderness Neurologic: Grossly intact Psychiatric: mood appropriate, affect full  Assessment: 26 y.o. G0P0000 routine annual exam Cervical cancer screening  Screening for sexually tranmitted infections   Plan: Problem List  Items Addressed This Visit   None Visit Diagnoses     Well woman exam    -  Primary   Relevant Orders   HEP, RPR, HIV Panel   Hepatitis C antibody   Cervical cancer screening       Relevant Orders   Cytology - PAP   Screening examination for venereal disease       Relevant Orders   HEP, RPR, HIV Panel   Hepatitis C antibody       1) 4) Gardasil Series discussed and if applicable offered to patient - Patient has previously completed 3 shot series with peds   2) STI screening  wasoffered and accepted  3)  ASCCP guidelines and rational discussed.  Patient opts for  based on results  screening interval  4) Contraception - the patient is currently using  Depo-Provera injections.  She is happy with her current form of contraception and plans to continue We discussed safe sex practices to reduce her furture risk of STI's.    5) fu PRN, will return for Depo-needs to get it from the [place] 06/09/2021, 2:02 PM

## 2021-06-09 NOTE — Telephone Encounter (Signed)
Pt calling for refill of depo be sent to [facility].  [phone]  Refills eRx'd for one year.  Pt aware.

## 2021-06-10 ENCOUNTER — Ambulatory Visit (INDEPENDENT_AMBULATORY_CARE_PROVIDER_SITE_OTHER): Payer: Medicaid Other

## 2021-06-10 DIAGNOSIS — Z3042 Encounter for surveillance of injectable contraceptive: Secondary | ICD-10-CM

## 2021-06-10 LAB — HEPATITIS C ANTIBODY: Hep C Virus Ab: <0.1 s/co ratio (ref 0.0–0.9)

## 2021-06-10 LAB — HEP, RPR, HIV PANEL
HIV Screen 4th Generation wRfx: NONREACTIVE
Hepatitis B Surface Ag: NEGATIVE
RPR Ser Ql: NONREACTIVE

## 2021-06-10 MED ORDER — MEDROXYPROGESTERONE ACETATE 150 MG/ML IM SUSP
150.0000 mg | Freq: Once | INTRAMUSCULAR | Status: AC
  Administered 2021-06-10: 11:00:00 150 mg via INTRAMUSCULAR

## 2021-06-10 NOTE — Progress Notes (Signed)
Pt here for depo which was given IM right glut.  Pt tolerated well.  NDC# [phone] °

## 2021-06-15 LAB — CYTOLOGY - PAP
Adequacy: ABSENT
Chlamydia: NEGATIVE
Comment: NEGATIVE
Comment: NEGATIVE
Comment: NORMAL
Diagnosis: REACTIVE
Neisseria Gonorrhea: POSITIVE — AB
Trichomonas: NEGATIVE

## 2021-06-16 ENCOUNTER — Encounter: Payer: Self-pay | Admitting: Licensed Practical Nurse

## 2021-06-16 ENCOUNTER — Other Ambulatory Visit: Payer: Self-pay | Admitting: Licensed Practical Nurse

## 2021-06-16 DIAGNOSIS — A549 Gonococcal infection, unspecified: Secondary | ICD-10-CM

## 2021-06-16 MED ORDER — AZITHROMYCIN 500 MG PO TABS
1000.0000 mg | ORAL_TABLET | Freq: Once | ORAL | 1 refills | Status: AC
Start: 2021-06-16 — End: 2021-06-16

## 2021-06-16 MED ORDER — CEFTRIAXONE SODIUM 250 MG IJ SOLR: 250.0000 mg | Freq: Once | INTRAMUSCULAR | Status: DC

## 2021-06-17 NOTE — Telephone Encounter (Signed)
Spoke w/patient regarding scheduling appointment for Rocephin injection. Patient transferred to front desk for scheduling.

## 2021-06-18 ENCOUNTER — Ambulatory Visit (INDEPENDENT_AMBULATORY_CARE_PROVIDER_SITE_OTHER): Payer: Self-pay

## 2021-06-18 ENCOUNTER — Other Ambulatory Visit: Payer: Self-pay

## 2021-06-18 DIAGNOSIS — A549 Gonococcal infection, unspecified: Secondary | ICD-10-CM

## 2021-06-18 MED ORDER — CEFTRIAXONE SODIUM 250 MG IJ SOLR
250.0000 mg | Freq: Once | INTRAMUSCULAR | Status: AC
  Administered 2021-06-18: 250 mg via INTRAMUSCULAR

## 2021-06-18 NOTE — Progress Notes (Signed)
Pt here for inf of cephtriaxone 250mg  which was given IM right glut after reconstituting with 1% xylocaine lot # [id number], EXP DATE: 11/2024. Pt didn't wiggle but did say 'ouuch'; then said gardasil was the worst.  Adv pt to not have sex again until a week after they have both been tx'd.  She states they are not together anymore.

## 2021-09-24 ENCOUNTER — Ambulatory Visit (INDEPENDENT_AMBULATORY_CARE_PROVIDER_SITE_OTHER): Payer: Medicaid Other

## 2021-09-24 ENCOUNTER — Ambulatory Visit: Payer: Medicaid Other

## 2021-09-24 DIAGNOSIS — Z3042 Encounter for surveillance of injectable contraceptive: Secondary | ICD-10-CM

## 2021-09-24 DIAGNOSIS — Z3202 Encounter for pregnancy test, result negative: Secondary | ICD-10-CM

## 2021-09-24 LAB — POCT URINE PREGNANCY: Preg Test, Ur: NEGATIVE

## 2021-09-24 MED ORDER — MEDROXYPROGESTERONE ACETATE 150 MG/ML IM SUSP
150.0000 mg | Freq: Once | INTRAMUSCULAR | Status: AC
  Administered 2021-09-24: 150 mg via INTRAMUSCULAR

## 2021-09-24 NOTE — Progress Notes (Signed)
Pt here for depo which was given IM right glut after negative urine preg test; pt tolerated well; AQT#[phone]. ?

## 2022-05-17 NOTE — L&D Delivery Note (Signed)
Delivery Note Debbie Coleman is a 27 y.o. G1P0000 at 100w0d admitted for SROM.   GBS Status:  Negative/-- (08/15 0901)  Labor course: Initial SVE: 2.5/70/-2. Baby was asynclytic during latent phase, straightened up into an OP position during active phase.  Because of a protracted active phase,  was given augmentation with: Pitocin. She then progressed to complete. ROM: 21h 49m with clear fluid  Birth: After a 30 minute 2nd stage, she delivered a Live born female  Birth Weight:   APGAR: 8, 9  Newborn Delivery   Birth date/time: 01/[birth year]/20[birth year] 05:39:18 Delivery type: Vaginal, Spontaneous        Delivered via spontaneous vaginal delivery (Presentation: LOA ). Nuchal cord present: No. . Shoulders and body delivered in usual fashion. Infant placed directly on mom's abdomen for bonding/skin-to-skin, baby dried and stimulated. Cord clamped x 2 after 1 minute and cut by FOB.  Cord blood collected. Placenta delivered-Spontaneous with 3 vessels. 20u Pitocin in 500cc LR given as a bolus after delivery of placenta.  Fundus firm with massage. Placenta inspected and appears to be intact with a 3 VC.  Sponge and instrument count were correct x2.  Intrapartum complications:  None Anesthesia:  epidural Lacerations:  periurethral Suture Repair: shallow, not repaired EBL (mL):308.00    Mom to postpartum.  Baby to Couplet care / Skin to Skin. Placenta to L&D   Plans to Breastfeed Contraception: Depo-Provera injections Circumcision: N/A  Note sent to [facility]: [facility] for pp visit.  Delivery Report:  Review the Delivery Report for details.     Signed: Jacklyn Shell, DNP,CNM 01/28/2023, 6:16 AM

## 2022-06-01 ENCOUNTER — Ambulatory Visit (INDEPENDENT_AMBULATORY_CARE_PROVIDER_SITE_OTHER): Payer: Self-pay

## 2022-06-01 VITALS — BP 104/69 | HR 79 | Ht 65.0 in | Wt 137.0 lb

## 2022-06-01 DIAGNOSIS — Z3201 Encounter for pregnancy test, result positive: Secondary | ICD-10-CM

## 2022-06-01 DIAGNOSIS — Z3200 Encounter for pregnancy test, result unknown: Secondary | ICD-10-CM

## 2022-06-01 LAB — POCT URINE PREGNANCY: Preg Test, Ur: POSITIVE — AB

## 2022-06-01 NOTE — Progress Notes (Signed)
    NURSE VISIT NOTE  Subjective:    Patient ID: Mechelle Salley Slaughter, female    DOB: 1995/08/25, 27 y.o.   MRN: [id number]  HPI  Patient is a 27 y.o. G0P0000 female who presents for evaluation of amenorrhea. She believes she could be pregnant. . Current symptoms also include: nausea but has resolved. Last period was abnormal unsure as to when  Her LMP was possibly October. Scheduled for [place]     Objective:    There were no vitals taken for this visit.  Lab Review  No results found for any visits on 06/01/22.  Assessment:   No diagnosis found.  Plan:   Pregnancy Test: Positive  Encouraged well-balanced diet, plenty of rest when needed, pre-natal vitamins daily and walking for exercise.  Discussed self-help for nausea, avoiding OTC medications until consulting provider or pharmacist, other than Tylenol as needed, minimal caffeine (1-2 cups daily) and avoiding alcohol.   She will schedule her nurse visit @ [redacted] wks pregnant, u/s for dating and labs @10  wk, and NOB visit at [redacted] wk pregnant.    Feel free to call with any questions.  Landis Gandy, [place]

## 2022-06-03 ENCOUNTER — Ambulatory Visit (INDEPENDENT_AMBULATORY_CARE_PROVIDER_SITE_OTHER): Payer: Self-pay

## 2022-06-03 ENCOUNTER — Ambulatory Visit
Admission: RE | Admit: 2022-06-03 | Discharge: 2022-06-03 | Disposition: A | Discharge status: Home / Self Care | Payer: Self-pay | Source: Ambulatory Visit | Attending: Licensed Practical Nurse | Admitting: Licensed Practical Nurse

## 2022-06-03 VITALS — Wt 137.0 lb

## 2022-06-03 DIAGNOSIS — Z3689 Encounter for other specified antenatal screening: Secondary | ICD-10-CM

## 2022-06-03 DIAGNOSIS — Z3480 Encounter for supervision of other normal pregnancy, unspecified trimester: Secondary | ICD-10-CM

## 2022-06-03 DIAGNOSIS — Z3400 Encounter for supervision of normal first pregnancy, unspecified trimester: Secondary | ICD-10-CM | POA: Insufficient documentation

## 2022-06-03 DIAGNOSIS — Z369 Encounter for antenatal screening, unspecified: Secondary | ICD-10-CM

## 2022-06-03 DIAGNOSIS — Z3200 Encounter for pregnancy test, result unknown: Secondary | ICD-10-CM | POA: Insufficient documentation

## 2022-06-03 DIAGNOSIS — Z3401 Encounter for supervision of normal first pregnancy, first trimester: Secondary | ICD-10-CM | POA: Insufficient documentation

## 2022-06-03 NOTE — Progress Notes (Signed)
New OB Intake  I connected with  Debbie Coleman on 06/03/22 at  8:15 AM EST by telephone and verified that I am speaking with the correct person using two identifiers. Nurse is located at [facility] and pt is located at home.  I explained I am completing New OB Intake today. We discussed her EDD of 01/21/2023 that is based on LMP of approx 04/16/2022. Pt is G1/P0. I reviewed her allergies, medications, Medical/Surgical/OB history, and appropriate screenings. There are cats in the home  yes If yes Indoor.  It's not the pt's cat so she doesn't change the litterbox.  Based on history, this is a/an pregnancy uncomplicated .   Patient Active Problem List   Diagnosis Date Noted   Supervision of other normal pregnancy, antepartum 06/03/2022   ADD (attention deficit disorder) 01/31/2020   Foot fracture, left 01/31/2020   Migraines 01/31/2020   Contraception management 11/22/2018   Chronic low back pain 03/02/2016   Depression with anxiety 09/28/2014   Bladder pain 03/05/2013   Chronic cystitis 03/05/2013   Increased frequency of urination 03/05/2013   Vesicoureteral reflux, unilateral 03/05/2013    Concerns addressed today None  Delivery Plans:  Plans to deliver at [facility] - wants a water birth.  Anatomy [place] Explained first scheduled [place] will be later today and an anatomy scan will be done at 20 weeks.  Labs Discussed genetic screening with patient. Patient declines genetic testing. Discussed possible labs to be drawn at new OB appointment.  COVID Vaccine Patient has not had COVID vaccine.   Social Determinants of Health Food Insecurity: expresses food insecurity. Information given on local food banks. Transportation: Patient denies transportation needs.  First visit review I reviewed new OB appt with pt. I explained she will have ob bloodwork and pap smear/pelvic exam if indicated. Explained pt will be seen by Dr. Clovia Cuff at first visit; encounter  routed to appropriate provider.   Cleophas Dunker, [facility] 06/03/2022  8:50 AM

## 2022-06-03 NOTE — Patient Instructions (Addendum)
First Trimester of Pregnancy  The first trimester of pregnancy starts on the first day of your last menstrual period until the end of week 12. This is also called months 1 through 3 of pregnancy. Body changes during your first trimester Your body goes through many changes during pregnancy. The changes usually return to normal after your baby is born. Physical changes You may gain or lose weight. Your breasts may grow larger and hurt. The area around your nipples may get darker. Dark spots or blotches may develop on your face. You may have changes in your hair. Health changes You may feel like you might vomit (nauseous), and you may vomit. You may have heartburn. You may have headaches. You may have trouble pooping (constipation). Your gums may bleed. Other changes You may get tired easily. You may pee (urinate) more often. Your menstrual periods will stop. You may not feel hungry. You may want to eat certain kinds of food. You may have changes in your emotions from day to day. You may have more dreams. Follow these instructions at home: Medicines Take over-the-counter and prescription medicines only as told by your doctor. Some medicines are not safe during pregnancy. Take a prenatal vitamin that contains at least 600 micrograms (mcg) of folic acid. Eating and drinking Eat healthy meals that include: Fresh fruits and vegetables. Whole grains. Good sources of protein, such as meat, eggs, or tofu. Low-fat dairy products. Avoid raw meat and unpasteurized juice, milk, and cheese. If you feel like you may vomit, or you vomit: Eat 4 or 5 small meals a day instead of 3 large meals. Try eating a few soda crackers. Drink liquids between meals instead of during meals. You may need to take these actions to prevent or treat trouble pooping: Drink enough fluids to keep your pee (urine) pale yellow. Eat foods that are high in fiber. These include beans, whole grains, and fresh fruits and  vegetables. Limit foods that are high in fat and sugar. These include fried or sweet foods. Activity Exercise only as told by your doctor. Most people can do their usual exercise routine during pregnancy. Stop exercising if you have cramps or pain in your lower belly (abdomen) or low back. Do not exercise if it is too hot or too humid, or if you are in a place of great height (high altitude). Avoid heavy lifting. If you choose to, you may have sex unless your doctor tells you not to. Relieving pain and discomfort Wear a good support bra if your breasts are sore. Rest with your legs raised (elevated) if you have leg cramps or low back pain. If you have bulging veins (varicose veins) in your legs: Wear support hose as told by your doctor. Raise your feet for 15 minutes, 3-4 times a day. Limit salt in your food. Safety Wear your seat belt at all times when you are in a car. Talk with your doctor if someone is hurting you or yelling at you. Talk with your doctor if you are feeling sad or have thoughts of hurting yourself. Lifestyle Do not use hot tubs, steam rooms, or saunas. Do not douche. Do not use tampons or scented sanitary pads. Do not use herbal medicines, illegal drugs, or medicines that are not approved by your doctor. Do not drink alcohol. Do not smoke or use any products that contain nicotine or tobacco. If you need help quitting, ask your doctor. Avoid cat litter boxes and soil that is used by cats. These carry  germs that can cause harm to the baby and can cause a loss of your baby by miscarriage or stillbirth. General instructions Keep all follow-up visits. This is important. Ask for help if you need counseling or if you need help with nutrition. Your doctor can give you advice or tell you where to go for help. Visit your dentist. At home, brush your teeth with a soft toothbrush. Floss gently. Write down your questions. Take them to your prenatal visits. Where to find more  information American Pregnancy Association: americanpregnancy.org American College of Obstetricians and Gynecologists: www.acog.org Office on Women's Health: womenshealth.gov/pregnancy Contact a doctor if: You are dizzy. You have a fever. You have mild cramps or pressure in your lower belly. You have a nagging pain in your belly area. You continue to feel like you may vomit, you vomit, or you have watery poop (diarrhea) for 24 hours or longer. You have a bad-smelling fluid coming from your vagina. You have pain when you pee. You are exposed to a disease that spreads from person to person, such as chickenpox, measles, Zika virus, HIV, or hepatitis. Get help right away if: You have spotting or bleeding from your vagina. You have very bad belly cramping or pain. You have shortness of breath or chest pain. You have any kind of injury, such as from a fall or a car crash. You have new or increased pain, swelling, or redness in an arm or leg. Summary The first trimester of pregnancy starts on the first day of your last menstrual period until the end of week 12 (months 1 through 3). Eat 4 or 5 small meals a day instead of 3 large meals. Do not smoke or use any products that contain nicotine or tobacco. If you need help quitting, ask your doctor. Keep all follow-up visits. This information is not intended to replace advice given to you by your health care provider. Make sure you discuss any questions you have with your health care provider. Document Revised: 10/10/2019 Document Reviewed: 08/16/2019 Elsevier Patient Education  2023 Elsevier Inc. Commonly Asked Questions During Pregnancy  Cats: A parasite can be excreted in cat feces.  To avoid exposure you need to have another person empty the little box.  If you must empty the litter box you will need to wear gloves.  Wash your hands after handling your cat.  This parasite can also be found in raw or undercooked meat so this should also be  avoided.  Colds, Sore Throats, Flu: Please check your medication sheet to see what you can take for symptoms.  If your symptoms are unrelieved by these medications please call the office.  Dental Work: Most any dental work your dentist recommends is permitted.  X-rays should only be taken during the first trimester if absolutely necessary.  Your abdomen should be shielded with a lead apron during all x-rays.  Please notify your provider prior to receiving any x-rays.  Novocaine is fine; gas is not recommended.  If your dentist requires a note from us prior to dental work please call the office and we will provide one for you.  Exercise: Exercise is an important part of staying healthy during your pregnancy.  You may continue most exercises you were accustomed to prior to pregnancy.  Later in your pregnancy you will most likely notice you have difficulty with activities requiring balance like riding a bicycle.  It is important that you listen to your body and avoid activities that put you at a higher   risk of falling.  Adequate rest and staying well hydrated are a must!  If you have questions about the safety of specific activities ask your provider.    Exposure to Children with illness: Try to avoid obvious exposure; report any symptoms to [place] when noted,  If you have chicken pos, red measles or mumps, you should be immune to these diseases.   Please do not take any vaccines while pregnant unless you have checked with your OB provider.  Fetal Movement: After 28 weeks we recommend you do "kick counts" twice daily.  Lie or sit down in a calm quiet environment and count your baby movements "kicks".  You should feel your baby at least 10 times per hour.  If you have not felt 10 kicks within the first hour get up, walk around and have something sweet to eat or drink then repeat for an additional hour.  If count remains less than 10 per hour notify your provider.  Fumigating: Follow your pest control agent's  advice as to how long to stay out of your home.  Ventilate the area well before re-entering.  Hemorrhoids:   Most over-the-counter preparations can be used during pregnancy.  Check your medication to see what is safe to use.  It is important to use a stool softener or fiber in your diet and to drink lots of liquids.  If hemorrhoids seem to be getting worse please call the office.   Hot Tubs:  Hot tubs Jacuzzis and saunas are not recommended while pregnant.  These increase your internal body temperature and should be avoided.  Intercourse:  Sexual intercourse is safe during pregnancy as long as you are comfortable, unless otherwise advised by your provider.  Spotting may occur after intercourse; report any bright red bleeding that is heavier than spotting.  Labor:  If you know that you are in labor, please go to the hospital.  If you are unsure, please call the office and let us help you decide what to do.  Lifting, straining, etc:  If your job requires heavy lifting or straining please check with your provider for any limitations.  Generally, you should not lift items heavier than that you can lift simply with your hands and arms (no back muscles)  Painting:  Paint fumes do not harm your pregnancy, but may make you ill and should be avoided if possible.  Latex or water based paints have less odor than oils.  Use adequate ventilation while painting.  Permanents & Hair Color:  Chemicals in hair dyes are not recommended as they cause increase hair dryness which can increase hair loss during pregnancy.  " Highlighting" and permanents are allowed.  Dye may be absorbed differently and permanents may not hold as well during pregnancy.  Sunbathing:  Use a sunscreen, as skin burns easily during pregnancy.  Drink plenty of fluids; avoid over heating.  Tanning Beds:  Because their possible side effects are still unknown, tanning beds are not recommended.  Ultrasound Scans:  Routine ultrasounds are performed  at approximately 20 weeks.  You will be able to see your baby's general anatomy an if you would like to know the gender this can usually be determined as well.  If it is questionable when you conceived you may also receive an ultrasound early in your pregnancy for dating purposes.  Otherwise ultrasound exams are not routinely performed unless there is a medical necessity.  Although you can request a scan we ask that you pay for it when  conducted because insurance does not cover " patient request" scans.  Work: If your pregnancy proceeds without complications you may work until your due date, unless your physician or employer advises otherwise.  Round Ligament Pain/Pelvic Discomfort:  Sharp, shooting pains not associated with bleeding are fairly common, usually occurring in the second trimester of pregnancy.  They tend to be worse when standing up or when you remain standing for long periods of time.  These are the result of pressure of certain pelvic ligaments called "round ligaments".  Rest, Tylenol and heat seem to be the most effective relief.  As the womb and fetus grow, they rise out of the pelvis and the discomfort improves.  Please notify the office if your pain seems different than that described.  It may represent a more serious condition.  Common Medications Safe in Pregnancy  Acne:      Constipation:  Benzoyl Peroxide     Colace  Clindamycin      Dulcolax Suppository  Topica Erythromycin     Fibercon  Salicylic Acid      Metamucil         Miralax AVOID:        Senakot   Accutane    Cough:  Retin-A       Cough Drops  Tetracycline      Phenergan w/ Codeine if Rx  Minocycline      Robitussin (Plain & DM)  Antibiotics:     Crabs/Lice:  Ceclor       RID  Cephalosporins    AVOID:  E-Mycins      Kwell  Keflex  Macrobid/Macrodantin   Diarrhea:  Penicillin      Kao-Pectate  Zithromax      Imodium AD         PUSH FLUIDS AVOID:       Cipro     Fever:  Tetracycline      Tylenol (Regular  or Extra  Minocycline       Strength)  Levaquin      Extra Strength-Do not          Exceed 8 tabs/24 hrs Caffeine:        <273m/day (equiv. To 1 cup of coffee or  approx. 3 12 oz sodas)         Gas: Cold/Hayfever:       Gas-X  Benadryl      Mylicon  Claritin       Phazyme  **Claritin-D        Chlor-Trimeton    Headaches:  Dimetapp      ASA-Free Excedrin  Drixoral-Non-Drowsy     Cold Compress  Mucinex (Guaifenasin)     Tylenol (Regular or Extra  Sudafed/Sudafed-12 Hour     Strength)  **Sudafed PE Pseudoephedrine   Tylenol Cold & Sinus     Vicks Vapor Rub  Zyrtec  **AVOID if Problems With Blood Pressure         Heartburn: Avoid lying down for at least 1 hour after meals  Aciphex      Maalox     Rash:  Milk of Magnesia     Benadryl    Mylanta       1% Hydrocortisone Cream  Pepcid  Pepcid Complete   Sleep Aids:  Prevacid      Ambien   Prilosec       Benadryl  Rolaids       Chamomile Tea  Tums (Limit 4/day)     Unisom  Tylenol PM         Warm milk-add vanilla or  Hemorrhoids:       Sugar for taste  Anusol/Anusol H.C.  (RX: Analapram 2.5%)  Sugar Substitutes:  Hydrocortisone OTC     Ok in moderation  Preparation H      Tucks        Vaseline lotion applied to tissue with wiping    Herpes:     Throat:  Acyclovir      Oragel  Famvir  Valtrex     Vaccines:         Flu Shot Leg Cramps:       *Gardasil  Benadryl      Hepatitis A         Hepatitis B Nasal Spray:       Pneumovax  Saline Nasal Spray     Polio Booster         Tetanus Nausea:       Tuberculosis test or PPD  Vitamin B6 25 mg TID   AVOID:    Dramamine      *Gardasil  Emetrol       Live Poliovirus  Ginger Root 250 mg QID    MMR (measles, mumps &  High Complex Carbs @ Bedtime    rebella)  Sea Bands-Accupressure    Varicella (Chickenpox)  Unisom 1/2 tab TID     *No known complications           If received before Pain:         Known pregnancy;   Darvocet       Resume series  after  Lortab        Delivery  Percocet    Yeast:   Tramadol      Femstat  Tylenol 3      Gyne-lotrimin  Ultram       Monistat  Vicodin           MISC:         All Sunscreens           Hair Coloring/highlights          Insect Repellant's          (Including DEET)         Mystic Tans

## 2022-06-22 ENCOUNTER — Encounter: Payer: Medicaid Other | Admitting: Obstetrics & Gynecology

## 2022-06-28 ENCOUNTER — Other Ambulatory Visit (HOSPITAL_COMMUNITY)
Admission: RE | Admit: 2022-06-28 | Discharge: 2022-06-28 | Disposition: A | Discharge status: Home / Self Care | Payer: Medicaid Other | Source: Ambulatory Visit | Attending: Obstetrics & Gynecology | Admitting: Obstetrics & Gynecology

## 2022-06-28 ENCOUNTER — Other Ambulatory Visit: Payer: Medicaid Other

## 2022-06-28 DIAGNOSIS — Z3480 Encounter for supervision of other normal pregnancy, unspecified trimester: Secondary | ICD-10-CM | POA: Diagnosis present

## 2022-06-28 DIAGNOSIS — Z369 Encounter for antenatal screening, unspecified: Secondary | ICD-10-CM | POA: Diagnosis present

## 2022-06-29 LAB — URINALYSIS, ROUTINE W REFLEX MICROSCOPIC
Bilirubin, UA: NEGATIVE
Glucose, UA: NEGATIVE
Ketones, UA: NEGATIVE
Leukocytes,UA: NEGATIVE
Nitrite, UA: NEGATIVE
RBC, UA: NEGATIVE
Specific Gravity, UA: 1.023 (ref 1.005–1.030)
Urobilinogen, Ur: 0.2 mg/dL (ref 0.2–1.0)
pH, UA: 7.5 (ref 5.0–7.5)

## 2022-06-30 LAB — URINE CYTOLOGY ANCILLARY ONLY
Chlamydia: NEGATIVE
Comment: NEGATIVE
Comment: NORMAL
Neisseria Gonorrhea: NEGATIVE

## 2022-06-30 LAB — CULTURE, OB URINE

## 2022-06-30 LAB — URINE CULTURE, OB REFLEX

## 2022-07-01 LAB — CBC/D/PLT+RPR+RH+ABO+RUBIGG...
Basophils Absolute: 0.1 10*3/uL (ref 0.0–0.2)
Basos: 1 %
EOS (ABSOLUTE): 0.6 10*3/uL — ABNORMAL HIGH (ref 0.0–0.4)
Eos: 5 %
HCV Ab: NONREACTIVE
HIV Screen 4th Generation wRfx: NONREACTIVE
Hematocrit: 40 % (ref 34.0–46.6)
Hemoglobin: 13.1 g/dL (ref 11.1–15.9)
Hepatitis B Surface Ag: NEGATIVE
Immature Grans (Abs): 0 10*3/uL (ref 0.0–0.1)
Immature Granulocytes: 0 %
Lymphocytes Absolute: 2 10*3/uL (ref 0.7–3.1)
Lymphs: 19 %
MCH: 28.9 pg (ref 26.6–33.0)
MCHC: 32.8 g/dL (ref 31.5–35.7)
MCV: 88 fL (ref 79–97)
Monocytes Absolute: 0.6 10*3/uL (ref 0.1–0.9)
Monocytes: 6 %
Neutrophils Absolute: 7.4 10*3/uL — ABNORMAL HIGH (ref 1.4–7.0)
Neutrophils: 69 %
Platelets: 339 10*3/uL (ref 150–450)
RBC: 4.54 x10E6/uL (ref 3.77–5.28)
RDW: 12.1 % (ref 11.7–15.4)
RPR Ser Ql: REACTIVE — AB
Rh Factor: POSITIVE
Rubella Antibodies, IGG: 1.61 index (ref >0.99)
Varicella zoster IgG: 161 index — ABNORMAL LOW (ref >165)
WBC: 10.8 10*3/uL (ref 3.4–10.8)

## 2022-07-01 LAB — RPR, QUANT+TP ABS (REFLEX)
Rapid Plasma Reagin, Quant: 1:1 {titer} — ABNORMAL HIGH
T Pallidum Abs: NONREACTIVE

## 2022-07-01 LAB — AB SCR+ANTIBODY ID: Antibody Screen: POSITIVE — AB

## 2022-07-01 LAB — HCV INTERPRETATION

## 2022-07-05 ENCOUNTER — Ambulatory Visit (INDEPENDENT_AMBULATORY_CARE_PROVIDER_SITE_OTHER): Payer: Self-pay | Admitting: Obstetrics & Gynecology

## 2022-07-05 ENCOUNTER — Encounter: Payer: Self-pay | Admitting: Obstetrics & Gynecology

## 2022-07-05 VITALS — BP 108/61 | HR 82 | Wt 142.0 lb

## 2022-07-05 DIAGNOSIS — Z3401 Encounter for supervision of normal first pregnancy, first trimester: Secondary | ICD-10-CM

## 2022-07-05 DIAGNOSIS — Z1379 Encounter for other screening for genetic and chromosomal anomalies: Secondary | ICD-10-CM

## 2022-07-05 DIAGNOSIS — Z3491 Encounter for supervision of normal pregnancy, unspecified, first trimester: Secondary | ICD-10-CM

## 2022-07-05 DIAGNOSIS — O99320 Drug use complicating pregnancy, unspecified trimester: Secondary | ICD-10-CM | POA: Insufficient documentation

## 2022-07-05 DIAGNOSIS — Z3A11 11 weeks gestation of pregnancy: Secondary | ICD-10-CM

## 2022-07-05 LAB — MONITOR DRUG PROFILE 14(MW)
Amphetamine Scrn, Ur: NEGATIVE ng/mL
BARBITURATE SCREEN URINE: NEGATIVE ng/mL
BENZODIAZEPINE SCREEN, URINE: NEGATIVE ng/mL
Buprenorphine, Urine: NEGATIVE ng/mL
Cocaine (Metab) Scrn, Ur: NEGATIVE ng/mL
Creatinine(Crt), U: 159.1 mg/dL (ref 20.0–300.0)
Fentanyl, Urine: NEGATIVE pg/mL
Meperidine Screen, Urine: NEGATIVE ng/mL
Methadone Screen, Urine: NEGATIVE ng/mL
OXYCODONE+OXYMORPHONE UR QL SCN: NEGATIVE ng/mL
Opiate Scrn, Ur: NEGATIVE ng/mL
Ph of Urine: 7.7 (ref 4.5–8.9)
Phencyclidine Qn, Ur: NEGATIVE ng/mL
Propoxyphene Scrn, Ur: NEGATIVE ng/mL
SPECIFIC GRAVITY: 1.022
Tramadol Screen, Urine: NEGATIVE ng/mL

## 2022-07-05 LAB — CANNABINOID (GC/MS), URINE
Cannabinoid: POSITIVE — AB
Carboxy THC (GC/MS): 355 ng/mL

## 2022-07-05 LAB — NICOTINE SCREEN, URINE: Cotinine Ql Scrn, Ur: NEGATIVE ng/mL

## 2022-07-05 NOTE — Progress Notes (Signed)
Subjective:    Debbie Coleman is a G1P0000 75w3dbeing seen today for her first obstetrical visit.  Her obstetrical history is significant for  none . Patient does intend to breast feed. Pregnancy history fully reviewed.  Patient reports nausea. She occasionally smokes pot to help with this. We discussed that there may be [facility] as[facility].  There were no vitals filed for this visit.  HISTORY: OB History  Gravida Para Term Preterm AB Living  1 0 0 0 0 0  SAB IAB Ectopic Multiple Live Births  0 0 0 0 0    # Outcome Date GA Lbr Len/2nd Weight Sex Delivery Anes PTL Lv  1 Current            Past Medical History:  Diagnosis Date   ADHD (attention deficit hyperactivity disorder)    Depression    Family history of breast cancer 08/2017   qualifies for updated genetic testing, may wait till older   Past Surgical History:  Procedure Laterality Date   FOOT SURGERY Left 09/2004   compound fracture of left foot-had open reduction and a second surgery to remove hardware.   WISDOM TOOTH EXTRACTION  age 89  Family History  Problem Relation Age of Onset   Anxiety disorder Mother    Healthy Father    Other Maternal Grandmother        acquired absence of kidney   Breast cancer Maternal Grandmother 441      BRCA negative not on tamoxifen after unknown estrogen   Hypertension Maternal Grandmother    Healthy Maternal Grandfather    Kidney failure Other    Healthy Half-Sister    Healthy Half-Sister    Healthy Half-Brother    Healthy Half-Brother    Healthy Half-Brother    Healthy Half-Brother      Exam    Uterus:     Pelvic Exam:    Perineum: No Hemorrhoids   Vulva: normal   Vagina:  normal mucosa   pH:    Cervix: anteverted   Adnexa: normal adnexa   Bony Pelvis: android  System: Breast:  normal appearance, no masses or tenderness   Skin: normal coloration and turgor, no rashes    Neurologic: oriented    Extremities: normal strength, tone, and muscle mass   HEENT PERRLA   Mouth/Teeth mucous membranes moist, pharynx normal without lesions   Neck supple   Cardiovascular: regular rate and rhythm   Respiratory:  appears well, vitals normal, no respiratory distress, acyanotic, normal RR, ear and throat exam is normal, neck free of mass or lymphadenopathy, chest clear, no wheezing, crepitations, rhonchi, normal symmetric air entry   Abdomen: soft, non-tender; bowel sounds normal; no masses,  no organomegaly   Urinary: urethral meatus normal      Assessment:    Pregnancy: G1P0000 Patient Active Problem List   Diagnosis Date Noted   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 Prenatal vitamins.  Problem list reviewed and updated. Mat 21 ordered for today  Ultrasound discussed; fetal survey: ordered.  Follow up in 4 weeks.   Emily Filbert 07/05/2022

## 2022-07-08 LAB — ANTIBODY SCREEN

## 2022-07-08 LAB — AB SCR+ANTIBODY ID: Antibody Screen: POSITIVE — AB

## 2022-07-09 LAB — MATERNIT 21 PLUS CORE, BLOOD
Fetal Fraction: 8
Result (T21): NEGATIVE
Trisomy 13 (Patau syndrome): NEGATIVE
Trisomy 18 (Edwards syndrome): NEGATIVE
Trisomy 21 (Down syndrome): NEGATIVE

## 2022-08-03 ENCOUNTER — Ambulatory Visit (INDEPENDENT_AMBULATORY_CARE_PROVIDER_SITE_OTHER): Payer: Self-pay | Admitting: Certified Nurse Midwife

## 2022-08-03 VITALS — BP 112/65 | HR 70 | Wt 141.9 lb

## 2022-08-03 DIAGNOSIS — Z3401 Encounter for supervision of normal first pregnancy, first trimester: Secondary | ICD-10-CM

## 2022-08-03 DIAGNOSIS — Z3A15 15 weeks gestation of pregnancy: Secondary | ICD-10-CM

## 2022-08-03 DIAGNOSIS — R899 Unspecified abnormal finding in specimens from other organs, systems and tissues: Secondary | ICD-10-CM

## 2022-08-03 MED ORDER — PRENATAL ADULT GUMMY/DHA/FA 0.4-25 MG PO CHEW: 1.0000 | CHEWABLE_TABLET | Freq: Every day | ORAL | 3 refills | Status: DC

## 2022-08-03 MED ORDER — BUTALBITAL-APAP-CAFFEINE 50-325-40 MG PO TABS: 1.0000 | ORAL_TABLET | Freq: Four times a day (QID) | ORAL | 0 refills | Status: AC | PRN

## 2022-08-03 MED ORDER — ONDANSETRON HCL 4 MG PO TABS: 4.0000 mg | ORAL_TABLET | Freq: Three times a day (TID) | ORAL | 0 refills | Status: DC | PRN

## 2022-08-03 NOTE — Progress Notes (Signed)
ROB doing well, feeling fluttering. Antibody screen re drawn today. Pt c/o migraine headaches with this pregnancy. States it is making nauseated, she has complained of just really feeling bad with headaches. She has tried hydrating, rest, dimming lights , tylenol. Discussed use of Fioricet . Risks and benefits reviewed. PT verbalizes understanding. Order place for 5 tablet for migraine headache not responding to other treatment. Also request alternative vitamin. States the current one makes her nausea. Discussed u/s for anatomy. Order placed. Follow up 4 wks for ROB.   Philip Aspen, CNM

## 2022-08-03 NOTE — Patient Instructions (Signed)
Round Ligament Pain  The round ligaments are a pair of cord-like tissues that help support the uterus. They can become a source of pain during pregnancy as the ligaments soften and stretch as the baby grows. The pain usually begins in the second trimester (13-28 weeks) of pregnancy, and should only last for a few seconds when it occurs. However, the pain can come and go until the baby is delivered. The pain does not cause harm to the baby. Round ligament pain is usually a short, sharp, and pinching pain, but it can also be a dull, lingering, and aching pain. The pain is felt in the lower side of the abdomen or in the groin. It usually starts deep in the groin and moves up to the outside of the hip area. The pain may happen when you: Suddenly change position, such as quickly going from a sitting to standing position. Do physical activity. Cough or sneeze. Follow these instructions at home: Managing pain  When the pain starts, relax. Then, try any of these methods to help with the pain: Sit down. Flex your knees up to your abdomen. Lie on your side with one pillow under your abdomen and another pillow between your legs. Sit in a warm bath for 15-20 minutes or until the pain goes away. General instructions Watch your condition for any changes. Move slowly when you sit down or stand up. Stop or reduce your physical activities if they cause pain. Avoid long walks if they cause pain. Take over-the-counter and prescription medicines only as told by your health care provider. Keep all follow-up visits. This is important. Contact a health care provider if: Your pain does not go away with treatment. You feel pain in your back that you did not have before. Your medicine is not helping. You have a fever or chills. You have nausea or vomiting. You have diarrhea. You have pain when you urinate. Get help right away if: You have pain that is a rhythmic, cramping pain similar to labor pains. Labor  pains are usually 2 minutes apart, last for about 1 minute, and involve a bearing down feeling or pressure in your pelvis. You have vaginal bleeding. These symptoms may represent a serious problem that is an emergency. Do not wait to see if the symptoms will go away. Get medical help right away. Call your local emergency services (911 in the [place] Do not drive yourself to the hospital. Summary Round ligament pain is felt in the lower abdomen or groin. This pain usually begins in the second trimester (13-28 weeks) and should only last for a few seconds when it occurs. You may notice the pain when you suddenly change position, when you cough or sneeze, or during physical activity. Relaxing, flexing your knees to your abdomen, lying on one side, or taking a warm bath may help to get rid of the pain. Contact your health care provider if the pain does not go away. This information is not intended to replace advice given to you by your health care provider. Make sure you discuss any questions you have with your health care provider. Document Revised: 07/16/2020 Document Reviewed: 07/16/2020 Elsevier Patient Education  2023 Elsevier Inc.  

## 2022-08-06 LAB — AB SCR+ANTIBODY ID: Antibody Screen: POSITIVE — AB

## 2022-08-06 LAB — ANTIBODY SCREEN

## 2022-08-09 NOTE — Progress Notes (Signed)
At this point we conclude that it is a non-specific antibody and is not likely to affect her pregnancy. Nothing else to do.

## 2022-08-11 ENCOUNTER — Telehealth: Payer: Self-pay

## 2022-08-11 MED ORDER — PRENATAL ADULT GUMMY/DHA/FA 0.4-25 MG PO CHEW: 1.0000 | CHEWABLE_TABLET | Freq: Every day | ORAL | 3 refills | Status: DC

## 2022-08-11 NOTE — Telephone Encounter (Signed)
Debbie Coleman called triage stating [facility] doesn't have the prenatal chews, so she asked me to send in another prescription to a different pharmacy. She as for me to send to [facility]

## 2022-08-31 ENCOUNTER — Encounter: Payer: Self-pay | Admitting: Obstetrics

## 2022-08-31 ENCOUNTER — Ambulatory Visit (INDEPENDENT_AMBULATORY_CARE_PROVIDER_SITE_OTHER): Payer: Self-pay | Admitting: Obstetrics

## 2022-08-31 ENCOUNTER — Ambulatory Visit (INDEPENDENT_AMBULATORY_CARE_PROVIDER_SITE_OTHER): Payer: Self-pay

## 2022-08-31 VITALS — BP 104/58 | HR 71 | Wt 150.0 lb

## 2022-08-31 DIAGNOSIS — Z3402 Encounter for supervision of normal first pregnancy, second trimester: Secondary | ICD-10-CM

## 2022-08-31 DIAGNOSIS — Z3A19 19 weeks gestation of pregnancy: Secondary | ICD-10-CM

## 2022-08-31 DIAGNOSIS — Z3401 Encounter for supervision of normal first pregnancy, first trimester: Secondary | ICD-10-CM

## 2022-08-31 DIAGNOSIS — Z3689 Encounter for other specified antenatal screening: Secondary | ICD-10-CM

## 2022-08-31 NOTE — Progress Notes (Signed)
ROB at [redacted]w[redacted]d. [place] today with incomplete views - order placed for repeat. Debbie Coleman reports that her migraines have improved. She has only used the Fioricet twice. Reports increased reflux. Will continue Prilosec. Encouraged CBE and BF class. Debbie Coleman is interested in water birth. Explained that only one provider is credentialed to offer water birth at this time and that class and tub rental are required. Recommended signing up for class if she is considering this option. RTC in 4 weeks.  [name], CNM

## 2022-09-28 ENCOUNTER — Other Ambulatory Visit: Payer: Self-pay | Admitting: Certified Nurse Midwife

## 2022-09-28 DIAGNOSIS — R899 Unspecified abnormal finding in specimens from other organs, systems and tissues: Secondary | ICD-10-CM

## 2022-09-29 ENCOUNTER — Ambulatory Visit (INDEPENDENT_AMBULATORY_CARE_PROVIDER_SITE_OTHER): Payer: Self-pay | Admitting: Obstetrics and Gynecology

## 2022-09-29 ENCOUNTER — Ambulatory Visit (INDEPENDENT_AMBULATORY_CARE_PROVIDER_SITE_OTHER): Payer: Self-pay

## 2022-09-29 ENCOUNTER — Encounter: Payer: Self-pay | Admitting: Obstetrics and Gynecology

## 2022-09-29 VITALS — BP 107/68 | HR 85 | Wt 155.6 lb

## 2022-09-29 DIAGNOSIS — Z3402 Encounter for supervision of normal first pregnancy, second trimester: Secondary | ICD-10-CM

## 2022-09-29 DIAGNOSIS — Z3A19 19 weeks gestation of pregnancy: Secondary | ICD-10-CM

## 2022-09-29 DIAGNOSIS — Z362 Encounter for other antenatal screening follow-up: Secondary | ICD-10-CM

## 2022-09-29 DIAGNOSIS — Z131 Encounter for screening for diabetes mellitus: Secondary | ICD-10-CM

## 2022-09-29 DIAGNOSIS — Z3A23 23 weeks gestation of pregnancy: Secondary | ICD-10-CM

## 2022-09-29 DIAGNOSIS — Z113 Encounter for screening for infections with a predominantly sexual mode of transmission: Secondary | ICD-10-CM

## 2022-09-29 NOTE — Progress Notes (Signed)
ROB. She states fetal movement. Reports continuing to have acid reflux, Prilosec helped but since she has stopped taking it reflux has been worse. Anatomy ultrasound follow-up completed.

## 2022-09-29 NOTE — Progress Notes (Signed)
ROB: She is generally feeling well.  Reports daily fetal movement.  Complains of significant heartburn.  She tried Prilosec and it worked but did not want to take it every day.  I have discussed continuation of Prilosec as well as using Tums.  She thinks that this strategy will work for her.  She completed her anatomy scan today.  1 hour GCT next visit.

## 2022-10-12 ENCOUNTER — Encounter: Payer: Self-pay | Admitting: Obstetrics

## 2022-10-27 ENCOUNTER — Ambulatory Visit (INDEPENDENT_AMBULATORY_CARE_PROVIDER_SITE_OTHER): Payer: Medicaid Other | Admitting: Obstetrics and Gynecology

## 2022-10-27 ENCOUNTER — Other Ambulatory Visit: Payer: Medicaid Other

## 2022-10-27 ENCOUNTER — Encounter: Payer: Self-pay | Admitting: Obstetrics and Gynecology

## 2022-10-27 VITALS — BP 105/68 | HR 72 | Wt 163.7 lb

## 2022-10-27 DIAGNOSIS — Z23 Encounter for immunization: Secondary | ICD-10-CM

## 2022-10-27 DIAGNOSIS — Z131 Encounter for screening for diabetes mellitus: Secondary | ICD-10-CM | POA: Diagnosis not present

## 2022-10-27 DIAGNOSIS — Z3402 Encounter for supervision of normal first pregnancy, second trimester: Secondary | ICD-10-CM

## 2022-10-27 DIAGNOSIS — Z3A27 27 weeks gestation of pregnancy: Secondary | ICD-10-CM

## 2022-10-27 DIAGNOSIS — Z113 Encounter for screening for infections with a predominantly sexual mode of transmission: Secondary | ICD-10-CM | POA: Diagnosis not present

## 2022-10-27 LAB — POCT URINALYSIS DIPSTICK OB
Bilirubin, UA: NEGATIVE
Blood, UA: NEGATIVE
Glucose, UA: NEGATIVE
Ketones, UA: NEGATIVE
Leukocytes, UA: NEGATIVE
Nitrite, UA: NEGATIVE
POC,PROTEIN,UA: NEGATIVE
Spec Grav, UA: 1.015 (ref 1.010–1.025)
Urobilinogen, UA: 0.2 E.U./dL
pH, UA: 7 (ref 5.0–8.0)

## 2022-10-27 NOTE — Progress Notes (Signed)
ROB: Doing well today.  Not requiring use of Fioricet for headaches.  Reports daily fetal movement taking vitamins.  1 hour GCT today.

## 2022-10-27 NOTE — Progress Notes (Signed)
ROB. Patient states daily fetal movement. She has been continuing to take Prilosec and it is helping. Completed GCT, received TDAP injection and signed BTC today. Patient states no questions or concerns at this time.

## 2022-10-28 LAB — 28 WEEK RH+PANEL
Basophils Absolute: 0.1 10*3/uL (ref 0.0–0.2)
Basos: 1 %
EOS (ABSOLUTE): 0.4 10*3/uL (ref 0.0–0.4)
Eos: 4 %
Gestational Diabetes Screen: 83 mg/dL (ref 70–139)
HIV Screen 4th Generation wRfx: NONREACTIVE
Hematocrit: 32.8 % — ABNORMAL LOW (ref 34.0–46.6)
Hemoglobin: 10.7 g/dL — ABNORMAL LOW (ref 11.1–15.9)
Immature Grans (Abs): 0.1 10*3/uL (ref 0.0–0.1)
Immature Granulocytes: 1 %
Lymphocytes Absolute: 1.6 10*3/uL (ref 0.7–3.1)
Lymphs: 15 %
MCH: 28.5 pg (ref 26.6–33.0)
MCHC: 32.6 g/dL (ref 31.5–35.7)
MCV: 87 fL (ref 79–97)
Monocytes Absolute: 0.7 10*3/uL (ref 0.1–0.9)
Monocytes: 7 %
Neutrophils Absolute: 7.4 10*3/uL — ABNORMAL HIGH (ref 1.4–7.0)
Neutrophils: 72 %
Platelets: 225 10*3/uL (ref 150–450)
RBC: 3.76 x10E6/uL — ABNORMAL LOW (ref 3.77–5.28)
RDW: 12.3 % (ref 11.7–15.4)
RPR Ser Ql: NONREACTIVE
WBC: 10.2 10*3/uL (ref 3.4–10.8)

## 2022-11-17 ENCOUNTER — Ambulatory Visit (INDEPENDENT_AMBULATORY_CARE_PROVIDER_SITE_OTHER): Payer: Medicaid Other | Admitting: Obstetrics

## 2022-11-17 ENCOUNTER — Encounter: Payer: Self-pay | Admitting: Obstetrics

## 2022-11-17 VITALS — BP 111/70 | HR 77 | Wt 163.0 lb

## 2022-11-17 DIAGNOSIS — Z3401 Encounter for supervision of normal first pregnancy, first trimester: Secondary | ICD-10-CM

## 2022-11-17 DIAGNOSIS — Z3403 Encounter for supervision of normal first pregnancy, third trimester: Secondary | ICD-10-CM

## 2022-11-17 DIAGNOSIS — Z3A30 30 weeks gestation of pregnancy: Secondary | ICD-10-CM

## 2022-11-17 LAB — POCT URINALYSIS DIPSTICK OB
Bilirubin, UA: NEGATIVE
Blood, UA: NEGATIVE
Glucose, UA: NEGATIVE
Ketones, UA: NEGATIVE
Leukocytes, UA: NEGATIVE
Nitrite, UA: NEGATIVE
POC,PROTEIN,UA: NEGATIVE
Spec Grav, UA: 1.02 (ref 1.010–1.025)
Urobilinogen, UA: 0.2 E.U./dL
pH, UA: 6.5 (ref 5.0–8.0)

## 2022-11-17 MED ORDER — FERROUS SULFATE 325 (65 FE) MG PO TBEC: 325.0000 mg | DELAYED_RELEASE_TABLET | Freq: Every day | ORAL | 3 refills | Status: DC

## 2022-11-17 NOTE — Progress Notes (Signed)
    Return Prenatal Note   Assessment/Plan   Plan  27 y.o. G1P0000 at [redacted]w[redacted]d presents for follow-up OB visit. Reviewed prenatal record including previous visit note.  Encounter for supervision of normal first pregnancy in first trimester -Anticipatory guidance about 3rd trimester -Link to water birth class/CBE sent via MyChart -Mild anemia. Rx for iron sent -Reviewed s/s of PTL    Orders Placed This Encounter  Procedures   POC Urinalysis Dipstick OB   Return in about 2 weeks (around 12/01/2022).   Future Appointments  Date Time Provider Department Center  12/01/2022  3:55 PM Mirna Mires, CNM [facility] None  12/15/2022  3:55 PM Hildred Laser, MD [facility] None  12/29/2022  3:55 PM Dominica Severin, CNM [facility] None  01/05/2023  3:55 PM [name], CNM [facility] None    For next visit:  continue with routine prenatal care     Subjective   27 y.o. G1P0000 at [redacted]w[redacted]d presents for this follow-up prenatal visit.  Dodie feels tired but is otherwise well. She is interested in waterbirth classes. Patient reports: Movement: Present Contractions: Not present  Objective   Flow sheet Vitals: Pulse Rate: 77 BP: 111/70 Fundal Height: 30 cm Fetal Heart Rate (bpm): 134 Total weight gain: 43 lb (19.5 kg)  General Appearance  No acute distress, well appearing, and well nourished Pulmonary   Normal work of breathing Neurologic   Alert and oriented to person, place, and time Psychiatric   Mood and affect within normal limits  [name], CNM  11/17/22 5:09 PM

## 2022-11-17 NOTE — Assessment & Plan Note (Signed)
-  Anticipatory guidance about 3rd trimester -Link to water birth class/CBE sent via MyChart -Mild anemia. Rx for iron sent -Reviewed s/s of PTL

## 2022-12-01 ENCOUNTER — Ambulatory Visit (INDEPENDENT_AMBULATORY_CARE_PROVIDER_SITE_OTHER): Payer: Medicaid Other | Admitting: Obstetrics

## 2022-12-01 VITALS — BP 112/62 | HR 80 | Wt 165.0 lb

## 2022-12-01 DIAGNOSIS — Z3A32 32 weeks gestation of pregnancy: Secondary | ICD-10-CM

## 2022-12-01 DIAGNOSIS — Z3401 Encounter for supervision of normal first pregnancy, first trimester: Secondary | ICD-10-CM

## 2022-12-01 NOTE — Progress Notes (Signed)
Routine Prenatal Care Visit  Subjective  Debbie Coleman is a 27 y.o. G1P0000 at [redacted]w[redacted]d being seen today for ongoing prenatal care.  She is currently monitored for the following issues for this low-risk pregnancy and has Depression with anxiety; Contraception management; ADD (attention deficit disorder); Bladder pain; Chronic cystitis; Chronic low back pain; Foot fracture, left; Increased frequency of urination; Migraines; Vesicoureteral reflux, unilateral; Encounter for supervision of normal first pregnancy in first trimester; and Marijuana use during pregnancy on their problem list.  ----------------------------------------------------------------------------------- Patient reports no complaints.  She is naming her baby Rebecka Apley, but will probably call her by a nick name. Contractions: Not present. Vag. Bleeding: None.  Movement: Present. Leaking Fluid denies.  ----------------------------------------------------------------------------------- The following portions of the patient's history were reviewed and updated as appropriate: allergies, current medications, past family history, past medical history, past social history, past surgical history and problem list. Problem list updated.  Objective  Blood pressure 112/62, pulse 80, weight 165 lb (74.8 kg), last menstrual period 04/16/2022. Pregravid weight 120 lb (54.4 kg) Total Weight Gain 45 lb (20.4 kg) Urinalysis: Urine Protein    Urine Glucose    Fetal Status: Fetal Heart Rate (bpm): 154 Fundal Height: 32 cm Movement: Present     General:  Alert, oriented and cooperative. Patient is in no acute distress.  Skin: Skin is warm and dry. No rash noted.   Cardiovascular: Normal heart rate noted  Respiratory: Normal respiratory effort, no problems with respiration noted  Abdomen: Soft, gravid, appropriate for gestational age. Pain/Pressure: Absent     Pelvic:  Cervical exam deferred        Extremities: Normal range of motion.  Edema:  None  Mental Status: Normal mood and affect. Normal behavior. Normal judgment and thought content.   Assessment   27 y.o. G1P0000 at [redacted]w[redacted]d by  01/21/2023, by Last Menstrual Period presenting for routine prenatal visit  Plan   first Problems (from 06/03/22 to present)     Problem Noted Resolved   Encounter for supervision of normal first pregnancy in first trimester 06/03/2022 by Loran Senters, CMA No   Overview Addendum 12/01/2022  5:09 PM by Mirna Mires, CNM     Clinical Staff Provider  Office Location  [facility] Dating  Not found.  Language  English Anatomy US    Flu Vaccine  offer Genetic Screen  NIPS:   TDaP vaccine  10/27/22 Hgb A1C or  GTT Early : Third trimester :   Covid declined   LAB RESULTS   Rhogam  O/Positive/-- (02/12 1456)  Blood Type O/Positive/-- (02/12 1456)  O      Positive        Feeding Plan Breast milk definitely; nipples are pierced Antibody Positive, See Final Results (03/19 1505)  Contraception depo Rubella 1.61 (02/12 1456)  Circumcision yes RPR Non Reactive (06/12 1120)   Pediatrician  undecided HBsAg Negative (02/12 1456)   Support Person Justin HIV Non Reactive (06/12 1120)  Prenatal Classes no Varicella Non immune    GBS  (For PCN allergy, check sensitivities)   BTL Consent  Hep C Non Reactive (02/12 1456)   VBAC Consent  Pap Diagnosis  Date Value Ref Range Status  06/09/2021 - Benign reactive/reparative changes  Final      Hgb Electro      CF      SMA                    Preterm labor symptoms and general obstetric  precautions including but not limited to vaginal bleeding, contractions, leaking of fluid and fetal movement were reviewed in detail with the patient. Please refer to After Visit Summary for other counseling recommendations.  Discussed the GBS testing and cultures at 37 weeks.  No follow-ups on file.  Mirna Mires, CNM  12/01/2022 5:11 PM

## 2022-12-15 ENCOUNTER — Ambulatory Visit (INDEPENDENT_AMBULATORY_CARE_PROVIDER_SITE_OTHER): Payer: Medicaid Other | Admitting: Obstetrics and Gynecology

## 2022-12-15 ENCOUNTER — Encounter: Payer: Self-pay | Admitting: Obstetrics and Gynecology

## 2022-12-15 VITALS — BP 104/55 | HR 83 | Wt 169.0 lb

## 2022-12-15 DIAGNOSIS — Z3A34 34 weeks gestation of pregnancy: Secondary | ICD-10-CM

## 2022-12-15 DIAGNOSIS — Z3403 Encounter for supervision of normal first pregnancy, third trimester: Secondary | ICD-10-CM

## 2022-12-15 NOTE — Progress Notes (Signed)
ROB: ROB: Patient is a 27 y.o. G1P0000 at [redacted]w[redacted]d who presents for routine OB care.  Pregnancy is complicated by Depression with anxiety; ADD (attention deficit disorder); Bladder pain; Chronic cystitis; Chronic low back pain; Foot fracture, left; Increased frequency of urination; Migraines; Vesicoureteral reflux, unilateral; Encounter for supervision of normal first pregnancy in first trimester; and Marijuana use during pregnancy.   Patient denies complaints. Notes it has been a good pregnancy so far.  Is still unsure about selection of pediatrician. She still desires a water birth, has plans to take class sometime next month.  RTC in 2 weeks, for 36-week labs at that time.  Monitor growth, size less than dates today.  PTL precautions advised.

## 2022-12-15 NOTE — Progress Notes (Signed)
ROB [redacted]w[redacted]d: She is doing well. She reports good fetal movement. She has no new concerns.

## 2022-12-20 ENCOUNTER — Telehealth: Payer: Self-pay

## 2022-12-20 NOTE — Telephone Encounter (Signed)
Pt called triage reporting that she will need to transfer offices to [place] because she want to have a water birth. Her Husband doesn't want to Rent a tub for [facility]. She states she has an appointment with [place] on the 14th and will discuss then. I advised if she wanted to transfer care she would have to call the other office she was interested in and go from there.

## 2022-12-29 ENCOUNTER — Other Ambulatory Visit (HOSPITAL_COMMUNITY)
Admission: RE | Admit: 2022-12-29 | Discharge: 2022-12-29 | Disposition: A | Discharge status: Home / Self Care | Payer: Medicaid Other | Source: Ambulatory Visit | Attending: Certified Nurse Midwife | Admitting: Certified Nurse Midwife

## 2022-12-29 ENCOUNTER — Ambulatory Visit (INDEPENDENT_AMBULATORY_CARE_PROVIDER_SITE_OTHER): Payer: Medicaid Other | Admitting: Certified Nurse Midwife

## 2022-12-29 VITALS — BP 104/64 | Wt 169.0 lb

## 2022-12-29 DIAGNOSIS — Z113 Encounter for screening for infections with a predominantly sexual mode of transmission: Secondary | ICD-10-CM

## 2022-12-29 DIAGNOSIS — Z3A36 36 weeks gestation of pregnancy: Secondary | ICD-10-CM

## 2022-12-29 DIAGNOSIS — Z3685 Encounter for antenatal screening for Streptococcus B: Secondary | ICD-10-CM

## 2022-12-29 DIAGNOSIS — Z3403 Encounter for supervision of normal first pregnancy, third trimester: Secondary | ICD-10-CM

## 2022-12-29 NOTE — Patient Instructions (Signed)
Third Trimester of Pregnancy  The third trimester of pregnancy is from week 28 through week 40. This is months 7 through 9. The third trimester is a time when the unborn baby (fetus) is growing rapidly. At the end of the ninth month, the fetus is about 20 inches long and weighs 6-10 pounds. Body changes during your third trimester During the third trimester, your body will continue to go through many changes. The changes vary and generally return to normal after your baby is born. Physical changes Your weight will continue to increase. You can expect to gain 25-35 pounds (11-16 kg) by the end of the pregnancy if you begin pregnancy at a normal weight. If you are underweight, you can expect to gain 28-40 lb (about 13-18 kg), and if you are overweight, you can expect to gain 15-25 lb (about 7-11 kg). You may begin to get stretch marks on your hips, abdomen, and breasts. Your breasts will continue to grow and may hurt. A yellow fluid (colostrum) may leak from your breasts. This is the first milk you are producing for your baby. You may have changes in your hair. These can include thickening of your hair, rapid growth, and changes in texture. Some people also have hair loss during or after pregnancy, or hair that feels dry or thin. Your belly button may stick out. You may notice more swelling in your hands, face, or ankles. Health changes You may have heartburn. You may have constipation. You may develop hemorrhoids. You may develop swollen, bulging veins (varicose veins) in your legs. You may have increased body aches in the pelvis, back, or thighs. This is due to weight gain and increased hormones that are relaxing your joints. You may have increased tingling or numbness in your hands, arms, and legs. The skin on your abdomen may also feel numb. You may feel short of breath because of your expanding uterus. Other changes You may urinate more often because the fetus is moving lower into your pelvis  and pressing on your bladder. You may have more problems sleeping. This may be caused by the size of your abdomen, an increased need to urinate, and an increase in your body's metabolism. You may notice the fetus "dropping," or moving lower in your abdomen (lightening). You may have increased vaginal discharge. You may notice that you have pain around your pelvic bone as your uterus distends. Follow these instructions at home: Medicines Follow your health care provider's instructions regarding medicine use. Specific medicines may be either safe or unsafe to take during pregnancy. Do not take any medicines unless approved by your health care provider. Take a prenatal vitamin that contains at least 600 micrograms (mcg) of folic acid. Eating and drinking Eat a healthy diet that includes fresh fruits and vegetables, whole grains, good sources of protein such as meat, eggs, or tofu, and low-fat dairy products. Avoid raw meat and unpasteurized juice, milk, and cheese. These carry germs that can harm you and your baby. Eat 4 or 5 small meals rather than 3 large meals a day. You may need to take these actions to prevent or treat constipation: Drink enough fluid to keep your urine pale yellow. Eat foods that are high in fiber, such as beans, whole grains, and fresh fruits and vegetables. Limit foods that are high in fat and processed sugars, such as fried or sweet foods. Activity Exercise only as directed by your health care provider. Most people can continue their usual exercise routine during pregnancy. Try   to exercise for 30 minutes at least 5 days a week. Stop exercising if you experience contractions in the uterus. Stop exercising if you develop pain or cramping in the lower abdomen or lower back. Avoid heavy lifting. Do not exercise if it is very hot or humid or if you are at a high altitude. If you choose to, you may continue to have sex unless your health care provider tells you not  to. Relieving pain and discomfort Take frequent breaks and rest with your legs raised (elevated) if you have leg cramps or low back pain. Take warm sitz baths to soothe any pain or discomfort caused by hemorrhoids. Use hemorrhoid cream if your health care provider approves. Wear a supportive bra to prevent discomfort from breast tenderness. If you develop varicose veins: Wear support hose as told by your health care provider. Elevate your feet for 15 minutes, 3-4 times a day. Limit salt in your diet. Safety Talk to your health care provider before traveling far distances. Do not use hot tubs, steam rooms, or saunas. Wear your seat belt at all times when driving or riding in a car. Talk with your health care provider if someone is verbally or physically abusive to you. Preparing for birth To prepare for the arrival of your baby: Take prenatal classes to understand, practice, and ask questions about labor and delivery. Visit the hospital and tour the maternity area. Purchase a rear-facing car seat and make sure you know how to install it in your car. Prepare the baby's room or sleeping area. Make sure to remove all pillows and stuffed animals from the baby's crib to prevent suffocation. General instructions Avoid cat litter boxes and soil used by cats. These carry germs that can cause birth defects in the baby. If you have a cat, ask someone to clean the litter box for you. Do not douche or use tampons. Do not use scented sanitary pads. Do not use any products that contain nicotine or tobacco, such as cigarettes, e-cigarettes, and chewing tobacco. If you need help quitting, ask your health care provider. Do not use any herbal remedies, illegal drugs, or medicines that were not prescribed to you. Chemicals in these products can harm your baby. Do not drink alcohol. You will have more frequent prenatal exams during the third trimester. During a routine prenatal visit, your health care provider  will do a physical exam, perform tests, and discuss your overall health. Keep all follow-up visits. This is important. Where to find more information American Pregnancy Association: americanpregnancy.org American College of Obstetricians and Gynecologists: acog.org/en/Womens%20Health/Pregnancy Office on Women's Health: womenshealth.gov/pregnancy Contact a health care provider if you have: A fever. Mild pelvic cramps, pelvic pressure, or nagging pain in your abdominal area or lower back. Vomiting or diarrhea. Bad-smelling vaginal discharge or foul-smelling urine. Pain when you urinate. A headache that does not go away when you take medicine. Visual changes or see spots in front of your eyes. Get help right away if: Your water breaks. You have regular contractions less than 5 minutes apart. You have spotting or bleeding from your vagina. You have severe abdominal pain. You have difficulty breathing. You have chest pain. You have fainting spells. You have not felt your baby move for the time period told by your health care provider. You have new or increased pain, swelling, or redness in an arm or leg. Summary The third trimester of pregnancy is from week 28 through week 40 (months 7 through 9). You may have more problems   sleeping. This can be caused by the size of your abdomen, an increased need to urinate, and an increase in your body's metabolism. You will have more frequent prenatal exams during the third trimester. Keep all follow-up visits. This is important. This information is not intended to replace advice given to you by your health care provider. Make sure you discuss any questions you have with your health care provider. Document Revised: 10/10/2019 Document Reviewed: 08/16/2019 Elsevier Patient Education  2024 Elsevier Inc.  Group B Streptococcus Infection During Pregnancy Group B Streptococcus (GBS) is a type of bacteria that is often found in healthy people. It is  commonly found in the rectum, vagina, and intestines. In people who are healthy and not pregnant, the bacteria rarely cause serious illness or complications. However, women who test positive for GBS during pregnancy can pass the bacteria to the baby during childbirth. This can cause serious infection in the baby after birth. Women with GBS may also have infections during their pregnancy or soon after childbirth. The infections include urinary tract infections (UTIs) or infections of the uterus. GBS also increases a woman's risk of complications during pregnancy, such as early labor or delivery, miscarriage, or stillbirth. Routine testing for GBS is recommended for all pregnant women. What are the causes? This condition is caused by bacteria called Streptococcus agalactiae. What increases the risk? You may have a higher risk for GBS infection during pregnancy if you had one during a past pregnancy. What are the signs or symptoms? In most cases, GBS infection does not cause symptoms in pregnant women. If symptoms exist, they may include: Labor that starts before the 37th week of pregnancy. A UTI or bladder infection. This may cause a fever, frequent urination, or pain and burning during urination. Fever during labor. There can also be a rapid heartbeat in the mother or baby. Rare but serious symptoms of a GBS infection in women include: Blood infection (septicemia). This may cause fever, chills, or confusion. Lung infection (pneumonia). This may cause fever, chills, cough, rapid breathing, chest pain, or difficulty breathing. Bone, joint, skin, or soft tissue infection. How is this diagnosed? You may be screened for GBS between week 35 and week 37 of pregnancy. If you have symptoms of preterm labor, you may be screened earlier. This condition is diagnosed based on lab test results from: A swab of fluid from the vagina and rectum. A urine sample. How is this treated? This condition is treated with  antibiotic medicine. Antibiotic medicine may be given: To you when you go into labor, or as soon as your water breaks. The medicines will continue until after you give birth. If you are having a cesarean delivery, you do not need antibiotics unless your water has broken. To your baby, if he or she requires treatment. Your health care provider will check your baby to decide if he or she needs antibiotics to prevent a serious infection. Follow these instructions at home: Take over-the-counter and prescription medicines only as told by your health care provider. Take your antibiotic medicine as told by your health care provider. Do not stop taking the antibiotic even if you start to feel better. Keep all pre-birth (prenatal) visits and follow-up visits as told by your health care provider. This is important. Contact a health care provider if: You have pain or burning when you urinate. You have to urinate more often than usual. You have a fever or chills. You develop a bad-smelling vaginal discharge. Get help right away if:   Your water breaks. You go into labor. You have severe pain in your abdomen. You have difficulty breathing. You have chest pain. These symptoms may represent a serious problem that is an emergency. Do not wait to see if the symptoms will go away. Get medical help right away. Call your local emergency services (911 in the [place] Do not drive yourself to the hospital. Summary GBS is a type of bacteria that is common in healthy people. During pregnancy, colonization with GBS can cause serious complications for you or your baby. Your health care provider will screen you between 35 and 37 weeks of pregnancy to determine if you are colonized with GBS. If you are colonized with GBS during pregnancy, your health care provider will recommend antibiotics through an IV during labor. After delivery, your baby will be evaluated for complications related to potential GBS infection and may  require antibiotics to prevent a serious infection. This information is not intended to replace advice given to you by your health care provider. Make sure you discuss any questions you have with your health care provider. Document Revised: 04/19/2022 Document Reviewed: 04/19/2022 Elsevier Patient Education  2024 Elsevier Inc.  

## 2022-12-29 NOTE — Progress Notes (Signed)
    Return Prenatal Note   Subjective   27 y.o. G1P0000 at [redacted]w[redacted]d presents for this follow-up prenatal visit.  Patient strongly desires waterbirth, has been unable to establish with [facility] practice. Aware of needing to rent tub, reviewed unable to do so through [facility]. Patient reports: Movement: Present Contractions: Not present  Objective   Flow sheet Vitals: BP: 104/64 Fundal Height: 35 cm Fetal Heart Rate (bpm): 140 Presentation: Vertex ([place]) Total weight gain: 49 lb (22.2 kg)  General Appearance  No acute distress, well appearing, and well nourished Pulmonary   Normal work of breathing Neurologic   Alert and oriented to person, place, and time Psychiatric   Mood and affect within normal limits  Assessment/Plan   Plan  27 y.o. G1P0000 at [redacted]w[redacted]d presents for follow-up OB visit. Reviewed prenatal record including previous visit note. Message sent to practice admin to enquire about transfer of care for waterbirth access, will notify Margret via Mychart once response received.   Encounter for supervision of normal first pregnancy in third trimester  Encounter for antenatal screening for Streptococcus B - Plan: Cervicovaginal ancillary only, Strep Gp B NAA  Screening for STDs (sexually transmitted diseases) - Plan: Cervicovaginal ancillary only, Strep Gp B NAA  [redacted] weeks gestation of pregnancy - Plan: Cervicovaginal ancillary only, Strep Gp B NAA      Orders Placed This Encounter  Procedures   Strep Gp B NAA   Return in 1 week (on 01/05/2023) for ROB.   Future Appointments  Date Time Provider Department Center  01/05/2023  3:55 PM [name], CNM [facility] None  01/12/2023  3:55 PM Free, Lindalou Hose, CNM [facility] None  01/19/2023  3:35 PM Dominica Severin, CNM [facility] None    For next visit:  continue with routine prenatal care     Dominica Severin, CNM  08/14/244:32 PM

## 2022-12-30 DIAGNOSIS — Z3A36 36 weeks gestation of pregnancy: Secondary | ICD-10-CM | POA: Diagnosis not present

## 2022-12-30 DIAGNOSIS — Z3685 Encounter for antenatal screening for Streptococcus B: Secondary | ICD-10-CM | POA: Diagnosis not present

## 2022-12-30 DIAGNOSIS — Z113 Encounter for screening for infections with a predominantly sexual mode of transmission: Secondary | ICD-10-CM | POA: Diagnosis not present

## 2022-12-31 LAB — CERVICOVAGINAL ANCILLARY ONLY
Chlamydia: NEGATIVE
Comment: NEGATIVE
Comment: NORMAL
Neisseria Gonorrhea: NEGATIVE

## 2023-01-01 LAB — STREP GP B NAA: Strep Gp B NAA: NEGATIVE

## 2023-01-03 ENCOUNTER — Encounter: Payer: Self-pay | Admitting: Advanced Practice Midwife

## 2023-01-03 ENCOUNTER — Ambulatory Visit (INDEPENDENT_AMBULATORY_CARE_PROVIDER_SITE_OTHER): Payer: Medicaid Other | Admitting: Advanced Practice Midwife

## 2023-01-03 VITALS — BP 132/73 | Wt 170.2 lb

## 2023-01-03 DIAGNOSIS — Z3403 Encounter for supervision of normal first pregnancy, third trimester: Secondary | ICD-10-CM

## 2023-01-03 DIAGNOSIS — Z3A37 37 weeks gestation of pregnancy: Secondary | ICD-10-CM

## 2023-01-03 NOTE — Progress Notes (Signed)
Pt presents for ROB visit. Pt transferred for water birth. Pt is up to date with care and has taken water birth class. No concerns

## 2023-01-03 NOTE — Progress Notes (Signed)
   PRENATAL VISIT NOTE  Subjective:  Debbie Coleman is a 27 y.o. G1P0000 at [redacted]w[redacted]d being seen today for ongoing prenatal care.  She is currently monitored for the following issues for this low-risk pregnancy and has Depression with anxiety; ADD (attention deficit disorder); Bladder pain; Chronic cystitis; Chronic low back pain; Increased frequency of urination; Migraines; Vesicoureteral reflux, unilateral; Supervision of normal first pregnancy; and Marijuana use during pregnancy on their problem list.  Patient reports no complaints.  Contractions: Not present. Vag. Bleeding: None.  Movement: Present. Denies leaking of fluid.   The following portions of the patient's history were reviewed and updated as appropriate: allergies, current medications, past family history, past medical history, past social history, past surgical history and problem list.   Objective:   Vitals:   01/03/23 1556  BP: 132/73  Weight: 170 lb 3.2 oz (77.2 kg)    Fetal Status: Fetal Heart Rate (bpm): 150 Fundal Height: 37 cm Movement: Present     General:  Alert, oriented and cooperative. Patient is in no acute distress.  Skin: Skin is warm and dry. No rash noted.   Cardiovascular: Normal heart rate noted  Respiratory: Normal respiratory effort, no problems with respiration noted  Abdomen: Soft, gravid, appropriate for gestational age.  Pain/Pressure: Absent     Pelvic: Cervical exam deferred        Extremities: Normal range of motion.  Edema: Trace  Mental Status: Normal mood and affect. Normal behavior. Normal judgment and thought content.   Assessment and Plan:  Pregnancy: G1P0000 at [redacted]w[redacted]d 1. Encounter for supervision of normal first pregnancy in third trimester --Anticipatory guidance about next visits/weeks of pregnancy given.  - Pt is interested in waterbirth.  No contraindications at this time per chart review/patient assessment.   - Pt has taken class, and has appt today with CNM as as a transfer  patient from [place].    - Discussed waterbirth as option for low-risk pregnancy.  Reviewed conditions that may arise during pregnancy that will risk pt out of waterbirth including hypertension, diabetes, fetal growth restriction <10%ile, etc.   2. [redacted] weeks gestation of pregnancy   Term labor symptoms and general obstetric precautions including but not limited to vaginal bleeding, contractions, leaking of fluid and fetal movement were reviewed in detail with the patient. Please refer to After Visit Summary for other counseling recommendations.   Return in about 1 week (around 01/10/2023) for Midwife preferred, LOB.  Future Appointments  Date Time Provider Department Center  01/12/2023  2:30 PM Sundra Aland, MD [facility] None    Sharen Counter, CNM

## 2023-01-05 ENCOUNTER — Encounter: Payer: Medicaid Other | Admitting: Obstetrics

## 2023-01-08 ENCOUNTER — Encounter: Payer: Self-pay | Admitting: Advanced Practice Midwife

## 2023-01-12 ENCOUNTER — Ambulatory Visit (INDEPENDENT_AMBULATORY_CARE_PROVIDER_SITE_OTHER): Payer: Medicaid Other | Admitting: Family Medicine

## 2023-01-12 VITALS — BP 128/71 | HR 86 | Wt 173.6 lb

## 2023-01-12 DIAGNOSIS — Z3403 Encounter for supervision of normal first pregnancy, third trimester: Secondary | ICD-10-CM | POA: Diagnosis not present

## 2023-01-12 DIAGNOSIS — Z3A38 38 weeks gestation of pregnancy: Secondary | ICD-10-CM

## 2023-01-12 NOTE — Progress Notes (Signed)
Pt presents for ROB visit. Requesting cervical check. 

## 2023-01-12 NOTE — Progress Notes (Signed)
   PRENATAL VISIT NOTE  Subjective:  Debbie Coleman is a 27 y.o. G1P0000 at [redacted]w[redacted]d being seen today for ongoing prenatal care.  She is currently monitored for the following issues for this low-risk pregnancy and has Depression with anxiety; ADD (attention deficit disorder); Bladder pain; Chronic cystitis; Chronic low back pain; Increased frequency of urination; Migraines; Vesicoureteral reflux, unilateral; Supervision of normal first pregnancy; and Marijuana use during pregnancy on their problem list.  Patient reports occasional contractions.  Contractions: Not present. Vag. Bleeding: None.  Movement: Present. Denies leaking of fluid.   The following portions of the patient's history were reviewed and updated as appropriate: allergies, current medications, past family history, past medical history, past social history, past surgical history and problem list.   Objective:   Vitals:   01/12/23 1435  BP: 128/71  Pulse: 86  Weight: 173 lb 9.6 oz (78.7 kg)    Fetal Status: Fetal Heart Rate (bpm): 128 (Simultaneous filing. User may not have seen previous data.) Fundal Height: 36 cm Movement: Present     General:  Alert, oriented and cooperative. Patient is in no acute distress.  Skin: Skin is warm and dry. No rash noted.   Cardiovascular: Normal heart rate noted  Respiratory: Normal respiratory effort, no problems with respiration noted  Abdomen: Soft, gravid, appropriate for gestational age.  Pain/Pressure: Absent     Pelvic: Cervical exam performed in the presence of a chaperone Dilation: 1 Effacement (%): 30 Station: -3  Extremities: Normal range of motion.  Edema: Trace  Mental Status: Normal mood and affect. Normal behavior. Normal judgment and thought content.   Assessment and Plan:  Pregnancy: G1P0000 at [redacted]w[redacted]d  Encounter for supervision of normal first pregnancy in third trimester  [redacted] weeks gestation of pregnancy Prenatal course reviewed BP, HR, FHR normal Feeling regular  FM  Labor precautions discussed with patient Cx exam 1/30/-2 today Plans on water birth   There are no diagnoses linked to this encounter. Term labor symptoms and general obstetric precautions including but not limited to vaginal bleeding, contractions, leaking of fluid and fetal movement were reviewed in detail with the patient. Please refer to After Visit Summary for other counseling recommendations.   Return in about 1 week (around 01/19/2023) for Routine prenatal care, MD or APP, In person.  No future appointments.   Sundra Aland, MD

## 2023-01-19 ENCOUNTER — Encounter: Payer: Medicaid Other | Admitting: Certified Nurse Midwife

## 2023-01-19 ENCOUNTER — Encounter: Payer: Self-pay | Admitting: Advanced Practice Midwife

## 2023-01-19 ENCOUNTER — Ambulatory Visit (INDEPENDENT_AMBULATORY_CARE_PROVIDER_SITE_OTHER): Payer: Medicaid Other | Admitting: Advanced Practice Midwife

## 2023-01-19 VITALS — BP 114/69 | HR 93 | Wt 173.1 lb

## 2023-01-19 DIAGNOSIS — Z3A39 39 weeks gestation of pregnancy: Secondary | ICD-10-CM

## 2023-01-19 DIAGNOSIS — Z3403 Encounter for supervision of normal first pregnancy, third trimester: Secondary | ICD-10-CM

## 2023-01-19 NOTE — Progress Notes (Signed)
   PRENATAL VISIT NOTE  Subjective:  Debbie Coleman is a 27 y.o. G1P0000 at [redacted]w[redacted]d being seen today for ongoing prenatal care.  She is currently monitored for the following issues for this low-risk pregnancy and has Depression with anxiety; ADD (attention deficit disorder); Bladder pain; Chronic cystitis; Chronic low back pain; Increased frequency of urination; Migraines; Vesicoureteral reflux, unilateral; Supervision of normal first pregnancy; and Marijuana use during pregnancy on their problem list.  Patient reports occasional contractions.  Contractions: Irritability. Vag. Bleeding: None.  Movement: Present. Denies leaking of fluid.   The following portions of the patient's history were reviewed and updated as appropriate: allergies, current medications, past family history, past medical history, past social history, past surgical history and problem list.   Objective:   Vitals:   01/19/23 1433  BP: 114/69  Pulse: 93  Weight: 173 lb 1.6 oz (78.5 kg)    Fetal Status: Fetal Heart Rate (bpm): 123 Fundal Height: 38 cm Movement: Present     General:  Alert, oriented and cooperative. Patient is in no acute distress.  Skin: Skin is warm and dry. No rash noted.   Cardiovascular: Normal heart rate noted  Respiratory: Normal respiratory effort, no problems with respiration noted  Abdomen: Soft, gravid, appropriate for gestational age.  Pain/Pressure: Present     Pelvic: Cervical exam performed in the presence of a chaperone Dilation: 1 Effacement (%): 60 Station: -2  Extremities: Normal range of motion.  Edema: None  Mental Status: Normal mood and affect. Normal behavior. Normal judgment and thought content.   Assessment and Plan:  Pregnancy: G1P0000 at [redacted]w[redacted]d 1. Encounter for supervision of normal first pregnancy in third trimester --Anticipatory guidance about next visits/weeks of pregnancy given.  --No barriers to waterbirth --Membranes swept at pt request  2. [redacted] weeks gestation  of pregnancy   Term labor symptoms and general obstetric precautions including but not limited to vaginal bleeding, contractions, leaking of fluid and fetal movement were reviewed in detail with the patient. Please refer to After Visit Summary for other counseling recommendations.   Return in about 1 week (around 01/26/2023) for Midwife preferred.  No future appointments.   Sharen Counter, CNM

## 2023-01-19 NOTE — Progress Notes (Signed)
Pt. Presents for Rob. Request a cervical check.

## 2023-01-24 ENCOUNTER — Inpatient Hospital Stay (HOSPITAL_COMMUNITY)
Admission: AD | Admit: 2023-01-24 | Discharge: 2023-01-25 | Disposition: A | Discharge status: Home / Self Care | Payer: Medicaid Other | Attending: Obstetrics and Gynecology | Admitting: Obstetrics and Gynecology

## 2023-01-24 DIAGNOSIS — O471 False labor at or after 37 completed weeks of gestation: Secondary | ICD-10-CM | POA: Insufficient documentation

## 2023-01-24 DIAGNOSIS — Z3A40 40 weeks gestation of pregnancy: Secondary | ICD-10-CM | POA: Insufficient documentation

## 2023-01-24 DIAGNOSIS — Z3403 Encounter for supervision of normal first pregnancy, third trimester: Secondary | ICD-10-CM

## 2023-01-24 DIAGNOSIS — O479 False labor, unspecified: Secondary | ICD-10-CM

## 2023-01-24 DIAGNOSIS — O480 Post-term pregnancy: Secondary | ICD-10-CM | POA: Insufficient documentation

## 2023-01-24 NOTE — MAU Note (Signed)
.  Debbie Coleman is a 27 y.o. at [redacted]w[redacted]d here in MAU reporting:   Contractions every: 5 minutes Onset of ctx: Today Pain score: 7/10  ROM: Intact Vaginal Bleeding: None Last SVE: 1 cm  Epidural: Planning Waterbirth  Fetal Movement: Reports positive FM FHT:140 via External  Vitals:   01/24/23 2240  BP: 126/76  Pulse: 71  Resp: 18  Temp: 98.2 F (36.8 C)  SpO2: 99%       OB Office: Faculty GBS: Negative HSV: Denies hx of HSV

## 2023-01-25 ENCOUNTER — Other Ambulatory Visit: Payer: Self-pay

## 2023-01-25 ENCOUNTER — Encounter (HOSPITAL_COMMUNITY): Payer: Self-pay | Admitting: Obstetrics and Gynecology

## 2023-01-25 ENCOUNTER — Ambulatory Visit (INDEPENDENT_AMBULATORY_CARE_PROVIDER_SITE_OTHER): Payer: Medicaid Other | Admitting: Advanced Practice Midwife

## 2023-01-25 VITALS — BP 125/82 | HR 97 | Wt 178.3 lb

## 2023-01-25 DIAGNOSIS — Z3A40 40 weeks gestation of pregnancy: Secondary | ICD-10-CM | POA: Diagnosis not present

## 2023-01-25 DIAGNOSIS — Z3403 Encounter for supervision of normal first pregnancy, third trimester: Secondary | ICD-10-CM

## 2023-01-25 DIAGNOSIS — O480 Post-term pregnancy: Secondary | ICD-10-CM | POA: Diagnosis not present

## 2023-01-25 DIAGNOSIS — O479 False labor, unspecified: Secondary | ICD-10-CM

## 2023-01-25 DIAGNOSIS — O471 False labor at or after 37 completed weeks of gestation: Secondary | ICD-10-CM | POA: Diagnosis present

## 2023-01-25 NOTE — MAU Provider Note (Addendum)
Debbie Coleman is a 27 y.o. G1P0000 female at [redacted]w[redacted]d  RN Labor check, not seen by provider SVE by RN: Dilation: 1.5 Effacement (%): 70 Station: -2 Exam by:: Anastasio Champion, RN NST: FHR baseline 125 bpm, Variability: moderate, Accelerations:present, Decelerations:  Absent= Cat 1/Reactive Toco: regular, every 2-3 minutes  D/C home as patient desires SOL with WB.   Claudette Head CNM,  01/25/2023 2:30 AM

## 2023-01-25 NOTE — Progress Notes (Signed)
Pt. Presents for ROB. Wants to be checked for dilation.

## 2023-01-26 ENCOUNTER — Encounter (HOSPITAL_COMMUNITY): Payer: Self-pay

## 2023-01-26 ENCOUNTER — Telehealth (HOSPITAL_COMMUNITY): Payer: Self-pay | Admitting: *Deleted

## 2023-01-26 ENCOUNTER — Other Ambulatory Visit: Payer: Self-pay | Admitting: Advanced Practice Midwife

## 2023-01-26 DIAGNOSIS — O480 Post-term pregnancy: Secondary | ICD-10-CM

## 2023-01-26 NOTE — Telephone Encounter (Signed)
Preadmission screen  

## 2023-01-27 ENCOUNTER — Inpatient Hospital Stay (HOSPITAL_COMMUNITY): Payer: Medicaid Other | Admitting: Anesthesiology

## 2023-01-27 ENCOUNTER — Inpatient Hospital Stay (HOSPITAL_COMMUNITY)
Admission: AD | Admit: 2023-01-27 | Discharge: 2023-01-29 | DRG: 807 | Disposition: A | Discharge status: Home / Self Care | Payer: Medicaid Other | Attending: Family Medicine | Admitting: Family Medicine

## 2023-01-27 ENCOUNTER — Other Ambulatory Visit: Payer: Self-pay

## 2023-01-27 ENCOUNTER — Encounter (HOSPITAL_COMMUNITY): Payer: Self-pay | Admitting: Obstetrics and Gynecology

## 2023-01-27 ENCOUNTER — Encounter (HOSPITAL_COMMUNITY): Payer: Self-pay | Admitting: *Deleted

## 2023-01-27 ENCOUNTER — Telehealth (HOSPITAL_COMMUNITY): Payer: Self-pay | Admitting: *Deleted

## 2023-01-27 DIAGNOSIS — Z8249 Family history of ischemic heart disease and other diseases of the circulatory system: Secondary | ICD-10-CM | POA: Diagnosis not present

## 2023-01-27 DIAGNOSIS — Z3A40 40 weeks gestation of pregnancy: Secondary | ICD-10-CM

## 2023-01-27 DIAGNOSIS — Z87891 Personal history of nicotine dependence: Secondary | ICD-10-CM

## 2023-01-27 DIAGNOSIS — Z3A41 41 weeks gestation of pregnancy: Secondary | ICD-10-CM | POA: Diagnosis not present

## 2023-01-27 DIAGNOSIS — O480 Post-term pregnancy: Secondary | ICD-10-CM | POA: Diagnosis present

## 2023-01-27 DIAGNOSIS — Z3403 Encounter for supervision of normal first pregnancy, third trimester: Principal | ICD-10-CM

## 2023-01-27 DIAGNOSIS — O26893 Other specified pregnancy related conditions, third trimester: Secondary | ICD-10-CM | POA: Diagnosis not present

## 2023-01-27 DIAGNOSIS — O99344 Other mental disorders complicating childbirth: Secondary | ICD-10-CM | POA: Diagnosis not present

## 2023-01-27 DIAGNOSIS — O4292 Full-term premature rupture of membranes, unspecified as to length of time between rupture and onset of labor: Secondary | ICD-10-CM | POA: Diagnosis not present

## 2023-01-27 DIAGNOSIS — O4202 Full-term premature rupture of membranes, onset of labor within 24 hours of rupture: Secondary | ICD-10-CM | POA: Diagnosis not present

## 2023-01-27 DIAGNOSIS — O4290 Premature rupture of membranes, unspecified as to length of time between rupture and onset of labor, unspecified weeks of gestation: Secondary | ICD-10-CM

## 2023-01-27 LAB — CBC
HCT: 37.5 % (ref 36.0–46.0)
Hemoglobin: 12.3 g/dL (ref 12.0–15.0)
MCH: 27.5 pg (ref 26.0–34.0)
MCHC: 32.8 g/dL (ref 30.0–36.0)
MCV: 83.7 fL (ref 80.0–100.0)
Platelets: 231 10*3/uL (ref 150–400)
RBC: 4.48 MIL/uL (ref 3.87–5.11)
RDW: 15.3 % (ref 11.5–15.5)
WBC: 12.7 10*3/uL — ABNORMAL HIGH (ref 4.0–10.5)
nRBC: 0 % (ref 0.0–0.2)

## 2023-01-27 LAB — TYPE AND SCREEN
ABO/RH(D): O POS
Antibody Screen: NEGATIVE

## 2023-01-27 LAB — POCT FERN TEST: POCT Fern Test: POSITIVE

## 2023-01-27 MED ORDER — LIDOCAINE HCL (PF) 1 % IJ SOLN: 30.0000 mL | INTRAMUSCULAR | Status: DC | PRN

## 2023-01-27 MED ORDER — SOD CITRATE-CITRIC ACID 500-334 MG/5ML PO SOLN
30.0000 mL | ORAL | Status: DC | PRN
  Administered 2023-01-27: 30 mL via ORAL
  Filled 2023-01-27: qty 30

## 2023-01-27 MED ORDER — OXYCODONE-ACETAMINOPHEN 5-325 MG PO TABS: 1.0000 | ORAL_TABLET | ORAL | Status: DC | PRN

## 2023-01-27 MED ORDER — OXYTOCIN BOLUS FROM INFUSION
333.0000 mL | Freq: Once | INTRAVENOUS | Status: AC
  Administered 2023-01-28: 333 mL via INTRAVENOUS

## 2023-01-27 MED ORDER — LACTATED RINGERS IV SOLN: 500.0000 mL | INTRAVENOUS | Status: DC | PRN

## 2023-01-27 MED ORDER — MISOPROSTOL 25 MCG QUARTER TABLET: 25.0000 ug | ORAL_TABLET | Freq: Once | ORAL | Status: DC

## 2023-01-27 MED ORDER — EPHEDRINE 5 MG/ML INJ: 10.0000 mg | INTRAVENOUS | Status: DC | PRN

## 2023-01-27 MED ORDER — OXYCODONE-ACETAMINOPHEN 5-325 MG PO TABS: 2.0000 | ORAL_TABLET | ORAL | Status: DC | PRN

## 2023-01-27 MED ORDER — PHENYLEPHRINE 80 MCG/ML (10ML) SYRINGE FOR IV PUSH (FOR BLOOD PRESSURE SUPPORT): 80.0000 ug | PREFILLED_SYRINGE | INTRAVENOUS | Status: DC | PRN

## 2023-01-27 MED ORDER — FENTANYL-BUPIVACAINE-NACL 0.5-0.125-0.9 MG/250ML-% EP SOLN
12.0000 mL/h | EPIDURAL | Status: DC | PRN
  Administered 2023-01-27: 12 mL/h via EPIDURAL

## 2023-01-27 MED ORDER — TERBUTALINE SULFATE 1 MG/ML IJ SOLN
0.2500 mg | Freq: Once | INTRAMUSCULAR | Status: DC | PRN
  Filled 2023-01-27: qty 1

## 2023-01-27 MED ORDER — ONDANSETRON HCL 4 MG/2ML IJ SOLN
4.0000 mg | Freq: Four times a day (QID) | INTRAMUSCULAR | Status: DC | PRN
  Administered 2023-01-27: 4 mg via INTRAVENOUS
  Filled 2023-01-27: qty 2

## 2023-01-27 MED ORDER — PHENYLEPHRINE 80 MCG/ML (10ML) SYRINGE FOR IV PUSH (FOR BLOOD PRESSURE SUPPORT)
PREFILLED_SYRINGE | INTRAVENOUS | Status: AC
  Filled 2023-01-27: qty 10

## 2023-01-27 MED ORDER — LACTATED RINGERS IV SOLN
500.0000 mL | Freq: Once | INTRAVENOUS | Status: AC
  Administered 2023-01-27: 500 mL via INTRAVENOUS

## 2023-01-27 MED ORDER — ACETAMINOPHEN 325 MG PO TABS: 650.0000 mg | ORAL_TABLET | ORAL | Status: DC | PRN

## 2023-01-27 MED ORDER — MISOPROSTOL 50MCG HALF TABLET: 50.0000 ug | ORAL_TABLET | Freq: Once | ORAL | Status: DC

## 2023-01-27 MED ORDER — FENTANYL CITRATE (PF) 100 MCG/2ML IJ SOLN
100.0000 ug | INTRAMUSCULAR | Status: DC | PRN
  Administered 2023-01-27: 100 ug via INTRAVENOUS
  Filled 2023-01-27: qty 2

## 2023-01-27 MED ORDER — TERBUTALINE SULFATE 1 MG/ML IJ SOLN: 0.2500 mg | Freq: Once | INTRAMUSCULAR | Status: DC | PRN

## 2023-01-27 MED ORDER — LIDOCAINE HCL (PF) 1 % IJ SOLN
INTRAMUSCULAR | Status: DC | PRN
  Administered 2023-01-27: 11 mL via EPIDURAL

## 2023-01-27 MED ORDER — OXYTOCIN-SODIUM CHLORIDE 30-0.9 UT/500ML-% IV SOLN
1.0000 m[IU]/min | INTRAVENOUS | Status: DC
  Administered 2023-01-27: 2 m[IU]/min via INTRAVENOUS

## 2023-01-27 MED ORDER — LACTATED RINGERS IV SOLN: INTRAVENOUS | Status: DC

## 2023-01-27 MED ORDER — DIPHENHYDRAMINE HCL 50 MG/ML IJ SOLN: 12.5000 mg | INTRAMUSCULAR | Status: DC | PRN

## 2023-01-27 MED ORDER — OXYTOCIN-SODIUM CHLORIDE 30-0.9 UT/500ML-% IV SOLN
2.5000 [IU]/h | INTRAVENOUS | Status: DC
  Filled 2023-01-27: qty 500

## 2023-01-27 MED ORDER — FENTANYL-BUPIVACAINE-NACL 0.5-0.125-0.9 MG/250ML-% EP SOLN
EPIDURAL | Status: AC
  Filled 2023-01-27: qty 250

## 2023-01-27 NOTE — Anesthesia Preprocedure Evaluation (Signed)
Anesthesia Evaluation  Patient identified by MRN, date of birth, ID band Patient awake    Reviewed: Allergy & Precautions, H&P , NPO status , Patient's Chart, lab work & pertinent test results  Airway Mallampati: II  TM Distance: >3 FB Neck ROM: Full    Dental no notable dental hx.    Pulmonary neg pulmonary ROS, former smoker   Pulmonary exam normal breath sounds clear to auscultation       Cardiovascular negative cardio ROS Normal cardiovascular exam Rhythm:Regular Rate:Normal     Neuro/Psych  Headaches  Anxiety Depression     negative psych ROS   GI/Hepatic negative GI ROS, Neg liver ROS,,,  Endo/Other  negative endocrine ROS    Renal/GU negative Renal ROS  negative genitourinary   Musculoskeletal negative musculoskeletal ROS (+)    Abdominal   Peds negative pediatric ROS (+)  Hematology negative hematology ROS (+)   Anesthesia Other Findings   Reproductive/Obstetrics (+) Pregnancy                             Anesthesia Physical Anesthesia Plan  ASA: 2  Anesthesia Plan: Epidural   Post-op Pain Management:    Induction:   PONV Risk Score and Plan:   Airway Management Planned:   Additional Equipment:   Intra-op Plan:   Post-operative Plan:   Informed Consent:   Plan Discussed with:   Anesthesia Plan Comments:        Anesthesia Quick Evaluation

## 2023-01-27 NOTE — Progress Notes (Signed)
Labor Progress Note Graciela Jaylinne Slappey is a 27 y.o. G1P0000 at [redacted]w[redacted]d presented for SROM  S: feeling strong contraction. Having some VB. FOB is at bedside and has two other support persons. Calm and breathing through contractions.   O:  BP (!) 116/59   Pulse 83   Temp 98 F (36.7 C) (Oral)   Resp 18   Ht 5\' 5"  (1.651 m)   Wt 80.7 kg   LMP 04/16/2022 (Approximate)   SpO2 99%   BMI 29.62 kg/m  EFM: 120/Moderate/+accels, no decels  CVE: Dilation: 6 Effacement (%): 80 Station: Plus 1 Presentation: Vertex Exam by:: Dr. Alvester Morin  AROM of forebag -- clear fluid  A&P: 26 y.o. G1P0000 [redacted]w[redacted]d here for SROM, normally progressing labor #Labor: Progressing well. AROM of forebag. Bleeding is due to cervical dilation, more like bloody show. OK to get in tub if desired.  #Pain: Coping well. Ok to submerge in water but also encouraged her to use upright positions to have help with pressure on cervic.  #FWB: Cat I #GBS negative  Federico Flake, MD 4:28 PM

## 2023-01-27 NOTE — Discharge Summary (Signed)
Postpartum Discharge Summary  Date of Service updated***     Patient Name: Debbie Coleman DOB: 05/12/1996 MRN: [id number]  Date of admission: 01/27/2023 Delivery date:01/28/2023 Delivering provider: Jacklyn Shell Date of discharge: 01/28/2023  Admitting diagnosis: Post term pregnancy over 40 weeks [O48.0] Intrauterine pregnancy: [redacted]w[redacted]d     Secondary diagnosis:  Principal Problem:   Post term pregnancy over 40 weeks Active Problems:   Vaginal delivery  Additional problems: ***    Discharge diagnosis: Term Pregnancy Delivered                                              Post partum procedures:{Postpartum procedures:23558} Augmentation: Pitocin Complications: None  Hospital course: Onset of Labor With Vaginal Delivery      27 y.o. yo G1P0000 at [redacted]w[redacted]d was admitted in Latent Labor on 01/27/2023. Labor course was complicated by protracted active phase  Membrane Rupture Time/Date: 8:00 AM,01/27/2023  Delivery Method:Vaginal, Spontaneous Operative Delivery:N/A Episiotomy: None Lacerations:  None Patient had a postpartum course complicated by ***.  She is ambulating, tolerating a regular diet, passing flatus, and urinating well. Patient is discharged home in stable condition on 01/28/23.  Newborn Data: Birth date:01/28/2023 Birth time:5:39 AM Gender:Female Living status:Living Apgars:8 ,9  Weight:   Magnesium Sulfate received: No BMZ received: No Rhophylac:N/A MMR:N/A T-DaP:Given prenatally Flu: N/A Transfusion:{Transfusion received:30440034}  Physical exam  Vitals:   01/28/23 0330 01/28/23 0400 01/28/23 0430 01/28/23 0500  BP: 114/70 121/75 (!) 109/59 125/80  Pulse: 82 (!) 105 95 90  Resp:      Temp:      TempSrc:      SpO2:      Weight:      Height:       General: {Exam; general:21111117} Lochia: {Desc; appropriate/inappropriate:30686::"appropriate"} Uterine Fundus: {Desc; firm/soft:30687} Incision: {Exam; incision:21111123} DVT Evaluation:  {Exam; dvt:2111122} Labs: Lab Results  Component Value Date   WBC 12.7 (H) 01/27/2023   HGB 12.3 01/27/2023   HCT 37.5 01/27/2023   MCV 83.7 01/27/2023   PLT 231 01/27/2023      Latest Ref Rng & Units 01/31/2020    9:30 AM  CMP  Glucose 65 - 99 mg/dL 81   BUN 6 - 20 mg/dL 14   Creatinine 4.25 - 1.00 mg/dL 9.56   Sodium 387 - 564 mmol/L 138   Potassium 3.5 - 5.2 mmol/L 4.3   Chloride 96 - 106 mmol/L 103   CO2 20 - 29 mmol/L 22   Calcium 8.7 - 10.2 mg/dL 9.2   Total Protein 6.0 - 8.5 g/dL 6.9   Total Bilirubin 0.0 - 1.2 mg/dL 0.3   Alkaline Phos 44 - 121 IU/L 56   AST 0 - 40 IU/L 20   ALT 0 - 32 IU/L 14    Edinburgh Score:    06/03/2022    8:41 AM  Edinburgh Postnatal Depression Scale Screening Tool  I have been able to laugh and see the funny side of things. 0  I have looked forward with enjoyment to things. 0  I have blamed myself unnecessarily when things went wrong. 0  I have been anxious or worried for no good reason. 2  I have felt scared or panicky for no good reason. 0  Things have been getting on top of me. 2  I have been so unhappy that I have had difficulty sleeping. 0  I have felt sad or miserable. 0  I have been so unhappy that I have been crying. 0  The thought of harming myself has occurred to me. 0  Edinburgh Postnatal Depression Scale Total 4     After visit meds:  Allergies as of 01/28/2023   No Known Allergies   Med Rec must be completed prior to using this [facility]***        Discharge home in stable condition Infant Feeding: {Baby feeding:23562} Infant Disposition:{CHL IP OB HOME WITH ZOXWRU:04540} Discharge instruction: per After Visit Summary and Postpartum booklet. Activity: Advance as tolerated. Pelvic rest for 6 weeks.  Diet: {OB JWJX:91478295} Future Appointments:No future appointments. Follow up Visit:   Please schedule this patient for a Virtual postpartum visit in 4 weeks with the following provider: Any  provider. Additional Postpartum F/U:  Low risk pregnancy complicated by:  Delivery mode:  Vaginal, Spontaneous Anticipated Birth Control:  Depo   01/28/2023 Jacklyn Shell, CNM

## 2023-01-27 NOTE — Telephone Encounter (Signed)
Preadmission screen  

## 2023-01-27 NOTE — H&P (Signed)
Debbie Coleman is a 27 y.o. female presenting for SOL/SROM at 0800 and then regular contractions starting at that time.   Denies HA, visual changes. Denies bleeding. Endorses FM.    Clinical Staff Provider  Office Location  [facility] Dating  LMP 04/16/2022  Language  English Anatomy US  Normal  Flu Vaccine  offer Genetic Screen  NIPS: Nml MaterniT21  TDaP vaccine  10/27/22 Hgb A1C or  GTT Early:  Third trimester : 107  Covid declined   LAB RESULTS   Rhogam  O/Positive/-- (02/12 1456)  Blood Type O/Positive/-- (02/12 1456)  O      Positive        Feeding Plan Breast milk definitely; nipples are pierced Antibody Positive, See Final Results (03/19 1505)  Contraception depo Rubella 1.61 (02/12 1456)  Circumcision yes RPR Non Reactive (06/12 1120)   Pediatrician  undecided HBsAg Negative (02/12 1456)   Support Person Justin HIV Non Reactive (06/12 1120)  Prenatal Classes no Varicella Non immune    GBS  (For PCN allergy, check sensitivities)   BTL Consent  Hep C Non Reactive (02/12 1456)   VBAC Consent  Pap Diagnosis  Date Value Ref Range Status  06/09/2021 - Benign reactive/reparative changes  Final      Hgb Electro      CF      SMA          . OB History     Gravida  1   Para  0   Term  0   Preterm  0   AB  0   Living  0      SAB  0   IAB  0   Ectopic  0   Multiple  0   Live Births  0          Past Medical History:  Diagnosis Date   ADHD (attention deficit hyperactivity disorder)    Chronic cystitis 03/05/2013   Chronic low back pain 03/02/2016   Depression    Depression with anxiety 09/28/2014   Family history of breast cancer 08/2017   qualifies for updated genetic testing, may wait till older   Foot fracture, left 01/31/2020   Migraines 01/31/2020   Past Surgical History:  Procedure Laterality Date   FOOT SURGERY Left 09/2004   compound fracture of left foot-had open reduction and a second surgery to remove hardware.    WISDOM TOOTH EXTRACTION  age 5   Family History: family history includes Anxiety disorder in her mother; Breast cancer (age of onset: 21) in her maternal grandmother; Healthy in her father, half-brother, half-brother, half-brother, half-brother, half-sister, half-sister, and maternal grandfather; Hypertension in her maternal grandmother; Kidney failure in an other family member; Other in her maternal grandmother. Social History:  reports that she has quit smoking. Her smoking use included cigarettes. She has never used smokeless tobacco. She reports that she does not currently use alcohol. She reports that she does not currently use drugs after having used the following drugs: Marijuana.     Maternal Diabetes: No Genetic Screening: Normal Maternal Ultrasounds/Referrals: Normal Fetal Ultrasounds or other Referrals:  None Maternal Substance Abuse:  No Significant Maternal Medications:  None Significant Maternal Lab Results:  Group B Strep negative Number of Prenatal Visits:greater than 3 verified prenatal visits Other Comments:   planning Waterbirth  Review of Systems  Constitutional:  Negative for chills and fever.  HENT:  Negative for congestion and sore throat.   Eyes:  Negative for  pain and visual disturbance.  Respiratory:  Negative for cough, chest tightness and shortness of breath.   Cardiovascular:  Negative for chest pain.  Gastrointestinal:  Negative for abdominal pain, diarrhea, nausea and vomiting.  Endocrine: Negative for cold intolerance and heat intolerance.  Genitourinary:  Negative for dysuria and flank pain.  Musculoskeletal:  Negative for back pain.  Skin:  Negative for rash.  Allergic/Immunologic: Negative for food allergies.  Neurological:  Negative for dizziness and light-headedness.  Psychiatric/Behavioral:  Negative for agitation.     Initial Exam in MAU: Dilation: 2.5 Effacement (%): 70 Station: -2 Exam by:: Georgina Snell, RN   Blood pressure (!)  116/59, pulse 83, temperature 98 F (36.7 C), temperature source Oral, resp. rate 18, height 5\' 5"  (1.651 m), weight 80.7 kg, last menstrual period 04/16/2022, SpO2 99%.  Physical Exam Vitals and nursing note reviewed.  Constitutional:      Appearance: Normal appearance.  HENT:     Head: Normocephalic and atraumatic.     Nose: Nose normal.     Mouth/Throat:     Mouth: Mucous membranes are moist.  Eyes:     Conjunctiva/sclera: Conjunctivae normal.  Cardiovascular:     Rate and Rhythm: Normal rate.  Pulmonary:     Effort: Pulmonary effort is normal.  Abdominal:     General: Abdomen is flat.     Palpations: Abdomen is soft.  Musculoskeletal:     Cervical back: Normal range of motion.  Skin:    General: Skin is warm.     Capillary Refill: Capillary refill takes less than 2 seconds.  Neurological:     General: No focal deficit present.     Mental Status: She is alert.  Psychiatric:        Mood and Affect: Mood normal.     Prenatal labs: ABO, Rh: --/--/O POS (09/12 1124) Antibody: NEG (09/12 1124) Rubella: 1.61 (02/12 1456) RPR: Non Reactive (06/12 1120)  HBsAg: Negative (02/12 1456)  HIV: Non Reactive (06/12 1120)  GBS: Negative/-- (08/15 0901)   Assessment/Plan: Labor: Desires Waterbirth. No contraindications at this time. Deferred repeat exam due to SROM. Consent for WB signed with CNM Dorathy Kinsman. Patient may labor in water  Fetal well being: Cat I in MAU, intermittent monitoring appropriate ID: GBS negative Contraception: Depo Infant feeding: breast milk, lactation consult recommended. Patient is bilateral nipple piercing.    Isa Rankin [facility] 01/27/2023, 4:27 PM

## 2023-01-27 NOTE — Anesthesia Procedure Notes (Signed)
Epidural Patient location during procedure: OB Start time: 01/27/2023 8:51 PM End time: 01/27/2023 9:04 PM  Staffing Anesthesiologist: Lowella Curb, MD Performed: anesthesiologist   Preanesthetic Checklist Completed: patient identified, IV checked, site marked, risks and benefits discussed, surgical consent, monitors and equipment checked, pre-op evaluation and timeout performed  Epidural Patient position: sitting Prep: ChloraPrep Patient monitoring: heart rate, cardiac monitor, continuous pulse ox and blood pressure Approach: midline Location: L2-L3 Injection technique: LOR saline  Needle:  Needle type: Tuohy  Needle gauge: 17 G Needle length: 9 cm Needle insertion depth: 5 cm Catheter type: closed end flexible Catheter size: 20 Guage Catheter at skin depth: 9 cm Test dose: negative  Assessment Events: blood not aspirated, injection not painful, no injection resistance, no paresthesia and negative IV test  Additional Notes Reason for block:procedure for pain

## 2023-01-27 NOTE — MAU Note (Signed)
Debbie Coleman is a 27 y.o. at [redacted]w[redacted]d here in MAU reporting: she's having ctxs that are 3-5 minutes apart and water broke @ 0800 this morning.  Reports fluid is clear.  Denies VB.  Endorses +FM. LMP: NA Onset of complaint: today Pain score: 9 Vitals:   01/27/23 1001  BP: 124/73  Pulse: 81  Resp: 18  Temp: (!) 97.4 F (36.3 C)  SpO2: 99%     FHT:138 bpm Lab orders placed from triage:   None

## 2023-01-27 NOTE — Progress Notes (Signed)
Patient Vitals for the past 4 hrs:  BP Pulse SpO2  01/27/23 2230 104/60 88 --  01/27/23 2200 (!) 103/56 78 --  01/27/23 2135 118/69 65 99 %  01/27/23 2115 118/70 83 99 %  01/27/23 2111 125/68 85 98 %  01/27/23 2105 120/83 89 --  01/27/23 2100 125/82 88 99 %  01/27/23 1925 115/60 83 --   Comfortable w/epidural.  FHR Cat 1.  IUPC placed. MVUs ~ 150. Cx 8/90/edematous ant cx @ 12 o'clock/+1, OP per bedside US.  Caput on 1/2 of baby's head, most likely from asynclitism earlier in labor. Pitocin started around 2200. Will titrate pitocin until adequate MVUs.  Frequent position changes to facilitate rotation.

## 2023-01-28 ENCOUNTER — Encounter (HOSPITAL_COMMUNITY): Payer: Self-pay | Admitting: Obstetrics and Gynecology

## 2023-01-28 ENCOUNTER — Inpatient Hospital Stay (HOSPITAL_COMMUNITY): Payer: Medicaid Other

## 2023-01-28 ENCOUNTER — Inpatient Hospital Stay (HOSPITAL_COMMUNITY)
Admission: RE | Admit: 2023-01-28 | Payer: Medicaid Other | Source: Home / Self Care | Admitting: Obstetrics and Gynecology

## 2023-01-28 DIAGNOSIS — O99344 Other mental disorders complicating childbirth: Secondary | ICD-10-CM | POA: Diagnosis not present

## 2023-01-28 DIAGNOSIS — O480 Post-term pregnancy: Secondary | ICD-10-CM | POA: Diagnosis not present

## 2023-01-28 DIAGNOSIS — Z3A40 40 weeks gestation of pregnancy: Secondary | ICD-10-CM | POA: Diagnosis not present

## 2023-01-28 DIAGNOSIS — O4202 Full-term premature rupture of membranes, onset of labor within 24 hours of rupture: Secondary | ICD-10-CM | POA: Diagnosis not present

## 2023-01-28 LAB — RPR
RPR Ser Ql: REACTIVE — AB
RPR Titer: 1:1 {titer}

## 2023-01-28 MED ORDER — METHYLERGONOVINE MALEATE 0.2 MG PO TABS: 0.2000 mg | ORAL_TABLET | ORAL | Status: DC | PRN

## 2023-01-28 MED ORDER — ACETAMINOPHEN 325 MG PO TABS
650.0000 mg | ORAL_TABLET | ORAL | Status: DC | PRN
  Administered 2023-01-28 – 2023-01-29 (×4): 650 mg via ORAL
  Filled 2023-01-28 (×4): qty 2

## 2023-01-28 MED ORDER — WITCH HAZEL-GLYCERIN EX PADS: 1.0000 | MEDICATED_PAD | CUTANEOUS | Status: DC | PRN

## 2023-01-28 MED ORDER — BENZOCAINE-MENTHOL 20-0.5 % EX AERO
1.0000 | INHALATION_SPRAY | CUTANEOUS | Status: DC | PRN
  Administered 2023-01-28 (×2): 1 via TOPICAL
  Filled 2023-01-28 (×2): qty 56

## 2023-01-28 MED ORDER — COCONUT OIL OIL: 1.0000 | TOPICAL_OIL | Status: DC | PRN

## 2023-01-28 MED ORDER — DIBUCAINE (PERIANAL) 1 % EX OINT: 1.0000 | TOPICAL_OINTMENT | CUTANEOUS | Status: DC | PRN

## 2023-01-28 MED ORDER — SENNOSIDES-DOCUSATE SODIUM 8.6-50 MG PO TABS
2.0000 | ORAL_TABLET | ORAL | Status: DC
  Administered 2023-01-29: 2 via ORAL
  Filled 2023-01-28: qty 2

## 2023-01-28 MED ORDER — FLEET ENEMA RE ENEM: 1.0000 | ENEMA | Freq: Every day | RECTAL | Status: DC | PRN

## 2023-01-28 MED ORDER — TETANUS-DIPHTH-ACELL PERTUSSIS 5-2.5-18.5 LF-MCG/0.5 IM SUSY: 0.5000 mL | PREFILLED_SYRINGE | Freq: Once | INTRAMUSCULAR | Status: DC

## 2023-01-28 MED ORDER — FERROUS SULFATE 325 (65 FE) MG PO TABS
325.0000 mg | ORAL_TABLET | ORAL | Status: DC
  Administered 2023-01-28: 325 mg via ORAL
  Filled 2023-01-28: qty 1

## 2023-01-28 MED ORDER — ONDANSETRON HCL 4 MG PO TABS: 4.0000 mg | ORAL_TABLET | ORAL | Status: DC | PRN

## 2023-01-28 MED ORDER — BISACODYL 10 MG RE SUPP: 10.0000 mg | Freq: Every day | RECTAL | Status: DC | PRN

## 2023-01-28 MED ORDER — SIMETHICONE 80 MG PO CHEW: 80.0000 mg | CHEWABLE_TABLET | ORAL | Status: DC | PRN

## 2023-01-28 MED ORDER — MEASLES, MUMPS & RUBELLA VAC IJ SOLR: 0.5000 mL | Freq: Once | INTRAMUSCULAR | Status: DC

## 2023-01-28 MED ORDER — DIPHENHYDRAMINE HCL 25 MG PO CAPS: 25.0000 mg | ORAL_CAPSULE | Freq: Four times a day (QID) | ORAL | Status: DC | PRN

## 2023-01-28 MED ORDER — METHYLERGONOVINE MALEATE 0.2 MG/ML IJ SOLN: 0.2000 mg | INTRAMUSCULAR | Status: DC | PRN

## 2023-01-28 MED ORDER — ONDANSETRON HCL 4 MG/2ML IJ SOLN: 4.0000 mg | INTRAMUSCULAR | Status: DC | PRN

## 2023-01-28 MED ORDER — PRENATAL MULTIVITAMIN CH
1.0000 | ORAL_TABLET | Freq: Every day | ORAL | Status: DC
  Administered 2023-01-29: 1 via ORAL
  Filled 2023-01-28: qty 1

## 2023-01-28 MED ORDER — IBUPROFEN 600 MG PO TABS
600.0000 mg | ORAL_TABLET | Freq: Four times a day (QID) | ORAL | Status: DC
  Administered 2023-01-28 – 2023-01-29 (×5): 600 mg via ORAL
  Filled 2023-01-28 (×5): qty 1

## 2023-01-28 MED ORDER — MEDROXYPROGESTERONE ACETATE 150 MG/ML IM SUSP: 150.0000 mg | INTRAMUSCULAR | Status: DC | PRN

## 2023-01-28 NOTE — Lactation Note (Addendum)
This note was copied from a baby's chart. Lactation Consultation Note  Patient Name: Debbie Coleman Today's Date: 01/28/2023 Age:28 hours Reason for consult: Initial assessment;1st time breastfeeding;Primapara;Term  Visited P1 birth parent for initial consult. Birth parent was not feeling great and mentioned baby fed for 15 minutes at 10am. She has a wearable pump at home and will set up an appointment with WIC to obtain a DEBP. She was harvesting colostrum at home through hand expression - reviewed expression milk storage guidelines including thawing.  LC offered to assist with latch, birth parent declined. She knows to call for latch assist later today if she would like.  Reviewed doc flowsheets (no wets, no stools at the time of consult) - appropriate for age.  LC reviewed importance of breastfeeding baby skin to skin every 2-3 hours (8-12x in a 24 hour period), newborn hunger cues, input/output, and breast changes in the next 24-48 hours.   Maternal Data Has patient been taught Hand Expression?: Yes Does the patient have breastfeeding experience prior to this delivery?: No  Feeding Mother's Current Feeding Choice: Breast Milk  LATCH Score No latch observed, Birth Parent not feeling great - will call for latch assist later today.   Interventions Interventions: Breast feeding basics reviewed;Education;LC Services brochure;Position options  Discharge Pump: Hands Free;Personal WIC Program: Yes  Consult Status Consult Status: Follow-up Date: 01/29/23 Follow-up type: In-patient    Antionette Char 01/28/2023, 12:21 PM

## 2023-01-28 NOTE — Progress Notes (Signed)
Patient Vitals for the past 4 hrs:  BP Temp Temp src Pulse  01/28/23 0130 114/72 -- -- 79  01/28/23 0106 -- 98.3 F (36.8 C) Oral --  01/28/23 0100 115/71 -- -- 73  01/28/23 0030 117/72 -- -- 84  01/28/23 0000 121/70 -- -- 74  01/27/23 2230 104/60 -- -- 88  01/27/23 2200 (!) 103/56 -- -- 78   FHR baseline 120s, decel in the 100's for several minutes, responded to position change.  Moderate variability, + accels.  Now Cat 1.  MVUs ~ 180, ptiocin at 6 mu/min.  Feeling some vaginal/rectal pressure.  Continue present mgt.

## 2023-01-29 MED ORDER — SENNOSIDES-DOCUSATE SODIUM 8.6-50 MG PO TABS: 2.0000 | ORAL_TABLET | Freq: Two times a day (BID) | ORAL | 0 refills | Status: DC | PRN

## 2023-01-29 NOTE — Clinical Social Work Maternal (Signed)
CLINICAL SOCIAL WORK MATERNAL/CHILD NOTE  Patient Details  Name: Debbie Coleman MRN: [id number] Date of Birth: 08-Aug-1995  Date:  01/29/2023  Clinical Social Worker Initiating Note:  Enos Fling Date/Time: Initiated:  01/29/23/1427     Child's Name:  Deedra Ehrich   Biological Parents:  Mother, Father (Aniqua Veal 1994/08/06  Debbie Coleman 06-22-1989)   Need for Interpreter:  None   Reason for Referral:  Current Substance Use/Substance Use During Pregnancy  , Behavioral Health Concerns   Address:   [address] [place], [place] [address]  Phone number:  [phone] (home)     Additional phone number:   Household Members/Support Persons (HM/SP):   Household Member/Support Person 1   HM/SP Name Relationship DOB or Age  HM/SP -1 Debbie Coleman FOB 06-22-1989  HM/SP -2        HM/SP -3        HM/SP -4        HM/SP -5        HM/SP -6        HM/SP -7        HM/SP -8          Natural Supports (not living in the home):  Spouse/significant other, Immediate Family   Professional Supports: None   Employment: Unemployed   Type of Work:     Education:  Some Materials engineer arranged:    Surveyor, quantity Resources:  [employer]   Other Resources:  [employer], Sales executive     Cultural/Religious Considerations Which May Impact Care:    Strengths:  Ability to meet basic needs  , Merchandiser, retail, Home prepared for child  , Understanding of illness   Psychotropic Medications:         Pediatrician:    Armed forces operational officer area  Pediatrician List:   [facility]  [place]    [place]    [place]      Pediatrician Fax Number:    Risk Factors/Current Problems:  Mental Health Concerns  , Substance Use     Cognitive State:  Alert  , Able to Concentrate  , Linear Thinking  , Insightful  , Goal Oriented     Mood/Affect:  Interested  , Comfortable  , Calm  , Relaxed     CSW Assessment: CSW received a consult for  drug exposed newborn and depression; and met MOB at bedside to complete a full psychosocial assessment. CSW entered the room, introduced herself and acknowledged that guest were present. CSW asked MOB for privacy could the guest step out for the assessment; MOB was agreeable and the guest stepped out. CSW explained her role and the reason for the visit. MOB was polite, easy to engage, receptive to meeting with CSW, and appeared forthcoming.  CSW collected MOB's demographic information and inquired about her mental health history. MOB reported being diagnosed with ADHD and Depression between 2014 and 2016. MOB reported being prescribed medication in the past; however she denied current use. MOB reported currently feeling "good" and bonded well with the infant. CSW provided education regarding the baby blues period vs. perinatal mood disorders, discussed treatment and gave resources for mental health follow up if concerns arise.  CSW recommends self-evaluation during the postpartum time period using the New Mom Checklist from Postpartum Progress and encouraged MOB to contact a medical professional if symptoms are noted at any time. CSW assessed for safety with MOB SI/HI/DV;MOB denied all.  MOB  reported having all essential items for the infant including a carseat, bassinet and crib for safe sleeping. MOB reported having all essential items for the infant including a carseat, bassinet and crib for safe sleeping. CSW provided review of Sudden Infant Death Syndrome (SIDS) precautions.  CSW informed MOB due to [facility] during her pregnancy; the hospital will perform a UDS and CDS on the infant. If the screenings return with positive results a report to CPS will be made; MOB was understanding. MOB reported using THC at the beginning of her pregnancy due to morning sickness; she was eventually able to switch to prescribed medication for support. MOB reported her last use of THC was in March 2024 and prior to pregnancy she  had used THC since 2014. MOB denied any use illicit substances.  CSW will continue to monitor UDS and CDS and make a report if warranted.  CSW Plan/Description:   No Further Intervention Required/No Barriers to Discharge, Sudden Infant Death Syndrome (SIDS) Education, Perinatal Mood and Anxiety Disorder (PMADs) Education, Hospital Drug Screen Policy Information, Other Information/Referral to [facility], CSW Will Continue to Monitor Umbilical Cord Tissue Drug Screen Results and Make Report if Joanie Coddington, LCSW 01/29/2023, 2:31 PM

## 2023-01-29 NOTE — Anesthesia Postprocedure Evaluation (Signed)
Anesthesia Post Note  Patient: Debbie Coleman  Procedure(s) Performed: AN AD HOC LABOR EPIDURAL     Patient location during evaluation: Mother Baby Anesthesia Type: Epidural Level of consciousness: awake Pain management: satisfactory to patient Vital Signs Assessment: post-procedure vital signs reviewed and stable Respiratory status: spontaneous breathing Cardiovascular status: stable Anesthetic complications: no  No notable events documented.  Last Vitals:  Vitals:   01/28/23 2257 01/29/23 0615  BP: 110/74 108/68  Pulse: 75 74  Resp: 18 18  Temp: 36.6 C 36.9 C  SpO2:      Last Pain:  Vitals:   01/29/23 0615  TempSrc: Oral  PainSc:    Pain Goal:                   Cephus Shelling

## 2023-01-29 NOTE — Lactation Note (Signed)
This note was copied from a baby's chart. Lactation Consultation Note  Patient Name: Debbie Coleman Today's Date: 01/29/2023 Age:28 hours Reason for consult: Follow-up assessment;1st time breastfeeding;Primapara;Term  Visited P1 mother for follow up consult. Mom reports feeding is going well. Reviewed doc flowsheets and I&O is WNL. LC observed excellent latch in cross cradle hold on right breast - mom reports feeling comfortable with breastfeeding.  Feeding Plan:  1) Breastfeed baby skin to skin every 2-3 hours, or sooner on demand if baby cues (8-12x in a 24 hour period). 2) Call RN/LC for breastfeeding assistance.   Maternal Data Has patient been taught Hand Expression?: Yes Does the patient have breastfeeding experience prior to this delivery?: No  Feeding Mother's Current Feeding Choice: Breast Milk  LATCH Score Latch: Grasps breast easily, tongue down, lips flanged, rhythmical sucking.  Audible Swallowing: Spontaneous and intermittent  Type of Nipple: Everted at rest and after stimulation  Comfort (Breast/Nipple): Soft / non-tender  Hold (Positioning): No assistance needed to correctly position infant at breast.  LATCH Score: 10  Interventions Interventions: Breast feeding basics reviewed;Assisted with latch;Skin to skin;Breast compression;Adjust position;Support pillows;Position options;LC Services brochure;Education  Discharge Pump: Hands Free;Personal WIC Program: Yes  Consult Status Consult Status: Follow-up Date: 01/30/23 Follow-up type: In-patient    Antionette Char 01/29/2023, 4:43 PM

## 2023-01-30 ENCOUNTER — Ambulatory Visit (HOSPITAL_COMMUNITY): Payer: Self-pay

## 2023-01-30 LAB — T.PALLIDUM AB, TOTAL: T Pallidum Abs: NONREACTIVE

## 2023-01-30 NOTE — Lactation Note (Signed)
This note was copied from a baby's chart. Lactation Consultation Note  Patient Name: Debbie Coleman Today's Date: 01/30/2023 Age:28 hours  Reason for consult: Follow-up assessment;Term  P1, [redacted]w[redacted]d, 7.3% weight loss  Baby being discharged to home today. Mother states breastfeeding is going well. Baby spit up a small amount of colostrum without any signs of distress while LC was in the room. Mother wanted a review of how to do hand expression and had questions related to breastfeeding. Last latch score was 9.  Mom made aware of O/P services, breastfeeding support groups, community resources, and our phone # for post-discharge questions.    Interventions Interventions: Education     Consult Status Consult Status: Complete Date: 01/30/23 Follow-up type: In-patient    Christella Hartigan M 01/30/2023, 10:55 AM

## 2023-02-07 NOTE — Progress Notes (Signed)
   PRENATAL VISIT NOTE  Subjective:  Debbie Coleman is a 27 y.o. G1P1001 at [redacted]w[redacted]d being seen today for ongoing prenatal care.  She is currently monitored for the following issues for this low-risk pregnancy and has Depression with anxiety; ADD (attention deficit disorder); Bladder pain; Chronic cystitis; Chronic low back pain; Increased frequency of urination; Migraines; Vesicoureteral reflux, unilateral; Supervision of normal first pregnancy; Marijuana use during pregnancy; Post term pregnancy over 40 weeks; and Vaginal delivery on their problem list.  Patient reports occasional contractions.  Contractions: Irregular. Vag. Bleeding: None.  Movement: Present. Denies leaking of fluid.   The following portions of the patient's history were reviewed and updated as appropriate: allergies, current medications, past family history, past medical history, past social history, past surgical history and problem list.   Objective:   Vitals:   01/25/23 1344  BP: 125/82  Pulse: 97  Weight: 178 lb 4.8 oz (80.9 kg)    Fetal Status: Fetal Heart Rate (bpm):  (NST-R) Fundal Height: 39 cm Movement: Present     General:  Alert, oriented and cooperative. Patient is in no acute distress.  Skin: Skin is warm and dry. No rash noted.   Cardiovascular: Normal heart rate noted  Respiratory: Normal respiratory effort, no problems with respiration noted  Abdomen: Soft, gravid, appropriate for gestational age.  Pain/Pressure: Present     Pelvic: Cervical exam performed in the presence of a chaperone Dilation: 1 Effacement (%): 50 Station: -2  Extremities: Normal range of motion.  Edema: None  Mental Status: Normal mood and affect. Normal behavior. Normal judgment and thought content.   Assessment and Plan:  Pregnancy: G1P1001 at [redacted]w[redacted]d 1. Encounter for supervision of normal first pregnancy in third trimester --Anticipatory guidance about next visits/weeks of pregnancy given.  --NST reactive today for  postterm pregnancy --IOL scheduled for 41 weeks --No barriers to waterbirth  2. [redacted] weeks gestation of pregnancy    Term labor symptoms and general obstetric precautions including but not limited to vaginal bleeding, contractions, leaking of fluid and fetal movement were reviewed in detail with the patient. Please refer to After Visit Summary for other counseling recommendations.   No follow-ups on file.  Future Appointments  Date Time Provider Department Center  03/01/2023  3:50 PM Joanne Gavel, MD [facility] None    Sharen Counter, CNM

## 2023-02-26 ENCOUNTER — Telehealth (HOSPITAL_COMMUNITY): Payer: Self-pay

## 2023-02-26 NOTE — Telephone Encounter (Signed)
02/26/2023 1523  Name: Debbie Coleman MRN: [id number] DOB: 12/16/1995  Reason for Call:  Transition of Care Hospital Discharge Call  Contact Status: Patient Contact Status: Message  Language assistant needed: Interpreter Mode: Interpreter Not Needed        Follow-Up Questions:    Edinburgh Postnatal Depression Scale:  In the Past 7 Days:    PHQ2-9 Depression Scale:     Discharge Follow-up:    Post-discharge interventions: NA  Signature  Signe Colt

## 2023-03-01 ENCOUNTER — Ambulatory Visit (INDEPENDENT_AMBULATORY_CARE_PROVIDER_SITE_OTHER): Payer: Medicaid Other | Admitting: Obstetrics and Gynecology

## 2023-03-01 ENCOUNTER — Encounter: Payer: Self-pay | Admitting: Obstetrics and Gynecology

## 2023-03-01 DIAGNOSIS — Z3202 Encounter for pregnancy test, result negative: Secondary | ICD-10-CM

## 2023-03-01 NOTE — Progress Notes (Signed)
Post Partum Visit Note  Debbie Coleman is a 27 y.o. G107P1001 female who presents for a postpartum visit. She is 4 weeks postpartum following a normal spontaneous vaginal delivery.  I have fully reviewed the prenatal and intrapartum course. The delivery was at 41 gestational weeks.  Anesthesia: epidural. Postpartum course has been good. Baby is doing well. Baby is feeding by bottle - Similac with Iron. Bleeding no bleeding. Bowel function is normal. Bladder function is normal. Patient is sexually active. Had unprotected intercourse last night. Planned contraception method is Depo-Provera injections. Postpartum depression screening: negative.   The pregnancy intention screening data noted above was reviewed. Potential methods of contraception were discussed. The patient elected to proceed with No data recorded.   Edinburgh Postnatal Depression Scale - 03/01/23 1609       Edinburgh Postnatal Depression Scale:  In the Past 7 Days   I have been able to laugh and see the funny side of things. 0    I have looked forward with enjoyment to things. 0    I have blamed myself unnecessarily when things went wrong. 0    I have been anxious or worried for no good reason. 0    I have felt scared or panicky for no good reason. 0    Things have been getting on top of me. 0    I have been so unhappy that I have had difficulty sleeping. 0    I have felt sad or miserable. 0    I have been so unhappy that I have been crying. 0    The thought of harming myself has occurred to me. 0    Edinburgh Postnatal Depression Scale Total 0             Health Maintenance Due  Topic Date Due   INFLUENZA VACCINE  12/16/2022   COVID-19 Vaccine (1 - 2023-24 season) Never done    The following portions of the patient's history were reviewed and updated as appropriate: allergies, current medications, past family history, past medical history, past social history, past surgical history, and problem  list.  Review of Systems Pertinent items are noted in HPI.  Objective:  BP 122/71   Pulse 77   Ht 5\' 5"  (1.651 m)   Wt 153 lb 9.6 oz (69.7 kg)   Breastfeeding No   BMI 25.56 kg/m    General:  alert, cooperative, and appears stated age   Breasts:  Deferred. No patient concerns  Lungs: clear to auscultation bilaterally  Heart:  regular rate and rhythm, S1, S2 normal, no murmur, click, rub or gallop  Abdomen: soft, non-tender; bowel sounds normal; no masses,  no organomegaly   Wound none  GU exam:  not indicated       Assessment:   1. Postpartum care following vaginal delivery Normal postpartum exam.   Plan:   Essential components of care per ACOG recommendations:  1.  Mood and well being: Patient with negative depression screening today. Reviewed local resources for support.  - Patient tobacco use? No.   - hx of drug use? Previous marijuana use  2. Infant care and feeding:  -Patient currently breastmilk feeding? No.  -Social determinants of health (SDOH) reviewed in EPIC. No concerns.  3. Sexuality, contraception and birth spacing - Patient does not want a pregnancy in the next year.  Desired family size is unsure.  - Reviewed reproductive life planning. Reviewed contraceptive methods based on pt preferences and effectiveness.  Patient desired  depo today. As she had unprotected intercourse last night, cannot be reasonably sure that she is not pregnant. To use condoms for next two weeks and return for pregnancy test and depo at that time - Discussed birth spacing of 18 months  4. Sleep and fatigue -Encouraged family/partner/community support of 4 hrs of uninterrupted sleep to help with mood and fatigue  5. Physical Recovery  - Discussed patients delivery and complications. She describes her labor as good. - Patient had a Vaginal, no problems at delivery. Patient had abrasion that did not require repair. Perineal healing reviewed. Patient expressed understanding -  Patient has urinary incontinence? No. - Patient is safe to resume physical and sexual activity  6.  Health Maintenance - HM due items addressed Yes - Last pap smear  Diagnosis  Date Value Ref Range Status  06/09/2021 - Benign reactive/reparative changes  Final   Pap smear not done at today's visit.  -Breast Cancer screening indicated? No.   7. Chronic Disease/Pregnancy Condition follow up: None  - RTC in 1 year or sooner if needed  Joanne Gavel, MD [facility] Center for [facility], [facility]

## 2023-03-11 DIAGNOSIS — F330 Major depressive disorder, recurrent, mild: Secondary | ICD-10-CM | POA: Diagnosis not present

## 2023-03-11 DIAGNOSIS — F900 Attention-deficit hyperactivity disorder, predominantly inattentive type: Secondary | ICD-10-CM | POA: Diagnosis not present

## 2023-03-11 DIAGNOSIS — F411 Generalized anxiety disorder: Secondary | ICD-10-CM | POA: Diagnosis not present

## 2023-03-15 ENCOUNTER — Encounter: Payer: Self-pay | Admitting: Emergency Medicine

## 2023-03-15 ENCOUNTER — Ambulatory Visit: Payer: Medicaid Other | Admitting: Emergency Medicine

## 2023-03-15 VITALS — BP 146/83 | HR 106 | Wt 154.0 lb

## 2023-03-15 DIAGNOSIS — Z3202 Encounter for pregnancy test, result negative: Secondary | ICD-10-CM | POA: Diagnosis not present

## 2023-03-15 DIAGNOSIS — Z3042 Encounter for surveillance of injectable contraceptive: Secondary | ICD-10-CM | POA: Diagnosis not present

## 2023-03-15 LAB — POCT URINE PREGNANCY
Preg Test, Ur: NEGATIVE
Preg Test, Ur: NEGATIVE

## 2023-03-15 MED ORDER — MEDROXYPROGESTERONE ACETATE 150 MG/ML IM SUSP
150.0000 mg | Freq: Once | INTRAMUSCULAR | Status: AC
Start: 2023-03-15 — End: 2023-03-15
  Administered 2023-03-15: 150 mg via INTRAMUSCULAR

## 2023-03-15 NOTE — Progress Notes (Signed)
Pt presents for 2nd UPT and Depo injection.  Date last pap: 06/09/2021. Last Depo-Provera: 1st depo since delivery on 01/27/23. Side Effects if any: NA. Serum HCG indicated? Urine HCG- Negative. Depo-Provera 150 mg IM given by: Resa Miner, RN into LD, tolerated well in office. Next appointment due Jan 14-Jan 28.

## 2023-03-15 NOTE — Addendum Note (Signed)
Addended by: Jearld Adjutant on: 03/15/2023 02:31 PM   Modules accepted: Orders

## 2023-04-08 DIAGNOSIS — F900 Attention-deficit hyperactivity disorder, predominantly inattentive type: Secondary | ICD-10-CM | POA: Diagnosis not present

## 2023-04-08 DIAGNOSIS — F330 Major depressive disorder, recurrent, mild: Secondary | ICD-10-CM | POA: Diagnosis not present

## 2023-04-08 DIAGNOSIS — F411 Generalized anxiety disorder: Secondary | ICD-10-CM | POA: Diagnosis not present

## 2023-06-07 ENCOUNTER — Ambulatory Visit: Payer: Medicaid Other

## 2023-06-07 ENCOUNTER — Other Ambulatory Visit: Payer: Self-pay | Admitting: *Deleted

## 2023-06-07 VITALS — BP 122/79 | HR 91 | Ht 65.0 in | Wt 161.0 lb

## 2023-06-07 DIAGNOSIS — Z3042 Encounter for surveillance of injectable contraceptive: Secondary | ICD-10-CM

## 2023-06-07 MED ORDER — MEDROXYPROGESTERONE ACETATE 150 MG/ML IM SUSP
150.0000 mg | Freq: Once | INTRAMUSCULAR | Status: AC
Start: 2023-06-07 — End: 2023-06-07
  Administered 2023-06-07: 150 mg via INTRAMUSCULAR

## 2023-06-07 MED ORDER — MEDROXYPROGESTERONE ACETATE 150 MG/ML IM SUSP
150.0000 mg | INTRAMUSCULAR | 2 refills | Status: AC
Start: 2023-06-07 — End: ?

## 2023-06-07 NOTE — Progress Notes (Signed)
RX Depo sent. Pt seen for PP visit and 1st and 2nd UPT's to restart Depo. RX not sent by provider.

## 2023-06-07 NOTE — Progress Notes (Addendum)
  Date last PAP: 06/09/2021. Last Depo-Provera: 03/15/2023. Side Effects if any: none. Serum HCG indicated? NA. Depo-Provera 150 mg IM given by: Redmond Pulling. RMA, in RUOQ, tolerated well. Next appointment due April 8 - 22, 2025.   Administrations This Visit     medroxyPROGESTERone (DEPO-PROVERA) injection 150 mg     Admin Date 06/07/2023 Action Given Dose 150 mg Route Intramuscular Documented By Maretta Bees, RMA

## 2023-07-04 DIAGNOSIS — F900 Attention-deficit hyperactivity disorder, predominantly inattentive type: Secondary | ICD-10-CM | POA: Diagnosis not present

## 2023-07-04 DIAGNOSIS — F330 Major depressive disorder, recurrent, mild: Secondary | ICD-10-CM | POA: Diagnosis not present

## 2023-07-04 DIAGNOSIS — F411 Generalized anxiety disorder: Secondary | ICD-10-CM | POA: Diagnosis not present

## 2023-07-19 ENCOUNTER — Ambulatory Visit: Payer: Medicaid Other | Admitting: Advanced Practice Midwife

## 2023-07-19 ENCOUNTER — Ambulatory Visit: Payer: Medicaid Other | Admitting: Obstetrics

## 2023-07-19 NOTE — Progress Notes (Deleted)
   Subjective:     Debbie Coleman is a 28 y.o. female here at [facility] *** for a routine exam.  Current complaints: ***.  Personal and family health history reviewed: {yes/no:9010}.  Do you have a primary care provider? *** Do you feel safe at home? ***  Flowsheet Row Office Visit from 06/30/2020 in [facility]  PHQ-2 Total Score 0       Health Maintenance Due  Topic Date Due   INFLUENZA VACCINE  12/16/2022   COVID-19 Vaccine (1 - 2024-25 season) Never done     Risk factors for chronic health problems: Smoking: Alchohol/how much: Pt BMI: There is no height or weight on file to calculate BMI.   Gynecologic History No LMP recorded. Contraception: {method:5051} Last Pap: 06/09/21. Results were: normal, transformation zone absent. Last mammogram: ***. Results were: {norm/abn:16337}  Obstetric History OB History  Gravida Para Term Preterm AB Living  1 1 1  0 0 1  SAB IAB Ectopic Multiple Live Births  0 0 0 0 1    # Outcome Date GA Lbr Len/2nd Weight Sex Type Anes PTL Lv  1 Term 01/28/23 [redacted]w[redacted]d  8 lb 8.5 oz (3.87 kg) F Vag-Spont EPI  LIV     Birth Comments: WDL     {Common ambulatory SmartLinks:19316}  Review of Systems {ros; complete:30496}    Objective:  There were no vitals taken for this visit.  VS reviewed, nursing note reviewed,  Constitutional: well developed, well nourished, no distress HEENT: normocephalic, thyroid without enlargement or mass HEART: RRR, no murmurs rubs/gallops RESP: clear and equal to auscultation bilaterally in all lobes  Breast Exam:  ***Deferred with low risks and shared decision making, discussed recommendation to start mammogram between 40-50 yo/ exam performed: right breast normal without mass, skin or nipple changes or axillary nodes, left breast normal without mass, skin or nipple changes or axillary nodes Abdomen: soft Neuro: alert and oriented x 3 Skin: warm, dry Psych: affect normal Pelvic exam: ***Deferred/  Performed: Cervix pink, visually closed, without lesion, scant white creamy discharge, vaginal walls and external genitalia normal Bimanual exam: Cervix 0/long/high, firm, anterior, neg CMT, uterus nontender, nonenlarged, adnexa without tenderness, enlargement, or mass        Assessment/Plan:   1. Encounter for annual routine gynecological examination (Primary) ***      No follow-ups on file.   Sharen Counter, CNM 8:36 AM

## 2023-08-16 ENCOUNTER — Ambulatory Visit: Admitting: Advanced Practice Midwife

## 2023-08-29 ENCOUNTER — Ambulatory Visit: Admitting: Advanced Practice Midwife

## 2023-08-30 ENCOUNTER — Ambulatory Visit: Payer: Medicaid Other

## 2023-09-01 ENCOUNTER — Ambulatory Visit: Admitting: Obstetrics and Gynecology

## 2023-09-01 ENCOUNTER — Other Ambulatory Visit (HOSPITAL_COMMUNITY)
Admission: RE | Admit: 2023-09-01 | Discharge: 2023-09-01 | Disposition: A | Discharge status: Home / Self Care | Source: Ambulatory Visit | Attending: Obstetrics and Gynecology | Admitting: Obstetrics and Gynecology

## 2023-09-01 ENCOUNTER — Encounter: Payer: Self-pay | Admitting: Obstetrics and Gynecology

## 2023-09-01 VITALS — BP 120/78 | HR 88 | Ht 65.0 in | Wt 158.0 lb

## 2023-09-01 DIAGNOSIS — Z3042 Encounter for surveillance of injectable contraceptive: Secondary | ICD-10-CM

## 2023-09-01 DIAGNOSIS — Z01419 Encounter for gynecological examination (general) (routine) without abnormal findings: Secondary | ICD-10-CM

## 2023-09-01 DIAGNOSIS — Z1339 Encounter for screening examination for other mental health and behavioral disorders: Secondary | ICD-10-CM

## 2023-09-01 DIAGNOSIS — N930 Postcoital and contact bleeding: Secondary | ICD-10-CM | POA: Diagnosis not present

## 2023-09-01 DIAGNOSIS — Z113 Encounter for screening for infections with a predominantly sexual mode of transmission: Secondary | ICD-10-CM

## 2023-09-01 MED ORDER — MEDROXYPROGESTERONE ACETATE 150 MG/ML IM SUSP
150.0000 mg | INTRAMUSCULAR | 4 refills | Status: DC
Start: 2023-09-01 — End: 2023-12-06

## 2023-09-01 MED ORDER — MEDROXYPROGESTERONE ACETATE 150 MG/ML IM SUSP
150.0000 mg | Freq: Once | INTRAMUSCULAR | Status: AC
Start: 2023-09-01 — End: 2023-09-01
  Administered 2023-09-01: 150 mg via INTRAMUSCULAR

## 2023-09-01 NOTE — Progress Notes (Signed)
 ANNUAL EXAM Patient name: Debbie Coleman MRN [id number]  Date of birth: 1995-11-24 Chief Complaint:   Gynecologic Exam  History of Present Illness:   Debbie Coleman is a 28 y.o. G52P1001 female being seen today for a routine annual exam.   Current complaints: spotting with depo, uncertain if she is bleeding after sex or just spotting with depo because she has sex everyday. Notices the spotting more after sex.   Gyn History: No LMP recorded. Patient has had an injection. Sexually active: yes/no: Yes Contraception: Depo-Provera Last pap: 2023 NILM Periods: light spotting, see above      06/30/2020   10:12 AM  Depression screen PHQ 2/9  Decreased Interest 0  Down, Depressed, Hopeless 0  PHQ - 2 Score 0  Altered sleeping 2  Tired, decreased energy 2  Change in appetite 0  Feeling bad or failure about yourself  0  Trouble concentrating 3  Moving slowly or fidgety/restless 0  Suicidal thoughts 0  PHQ-9 Score 7        06/30/2020   10:13 AM  GAD 7 : Generalized Anxiety Score  Nervous, Anxious, on Edge 3  Control/stop worrying 1  Worry too much - different things 1  Trouble relaxing 0  Restless 1  Easily annoyed or irritable 0  Afraid - awful might happen 2  Total GAD 7 Score 8     Review of Systems:   Pertinent items are noted in HPI Denies any headaches, blurred vision, fatigue, shortness of breath, chest pain, abdominal pain, abnormal vaginal discharge/itching/odor/irritation, problems with periods, bowel movements, urination, or intercourse unless otherwise stated above. Pertinent History Reviewed:  Reviewed past medical,surgical, social and family history.  Reviewed problem list, medications and allergies. Physical Assessment:   Vitals:   09/01/23 1005  BP: 120/78  Pulse: 88  Weight: 158 lb (71.7 kg)  Height: 5\' 5"  (1.651 m)  Body mass index is 26.29 kg/m.        Physical Examination:   General appearance - well appearing, and in no  distress  Mental status - alert, oriented to person, place, and time  Psych:  She has a normal mood and affect  Skin - warm and dry, normal color, no suspicious lesions noted  Chest - effort normal, all lung fields clear to auscultation bilaterally  Heart - normal rate and regular rhythm  Neck:  midline trachea, no thyromegaly or nodules  Breasts - breasts appear normal, no suspicious masses, no skin or nipple changes or  axillary nodes  Abdomen - soft, nontender, nondistended, no masses or organomegaly  Pelvic -  VULVA: normal appearing vulva with no masses, tenderness or lesions   VAGINA: normal appearing vagina with normal color and discharge, no lesions  CERVIX: normal appearing cervix without discharge or lesions, no CMT  Thin prep pap is done with reflex HR HPV cotesting  UTERUS: uterus is felt to be normal size, shape, consistency and nontender   ADNEXA: No adnexal masses or tenderness noted.  Extremities:  No swelling or varicosities noted  Chaperone present for exam  No results found for this or any previous visit (from the past 24 hours).  Assessment & Plan:  1. Well woman exam with routine gynecological exam (Primary) Pap up to date but collected today given postcoital bleeding (see below) Normal clinical breast exam STI testing Continue depo   2. Postcoital bleeding Unclear if spotting from depo or postcoital bleeding Swab & pap collected, if normal, observation - Cervicovaginal ancillary only( [facility]) - Cytology - PAP  3. Encounter for Depo-Provera contraception Counseled on common side effects of depo including decrease bone density, slower return to fertility after discontinuation and recent news headlines of brain tumors (though the overall risk is very low). Pt verbalized understanding, continue depo - medroxyPROGESTERone (DEPO-PROVERA) 150 MG/ML injection; Inject 1 mL (150 mg total) into the muscle every 3 (three) months.  Dispense: 1 mL; Refill: 4  4.  Routine screening for STI (sexually transmitted infection) - Cervicovaginal ancillary only( [place]) - HIV antibody (with reflex) - Hepatitis C Antibody - Hepatitis B Surface AntiGEN - RPR   Follow-up: No follow-ups on file.  Marci Setter, MD 09/01/2023 10:32 AM

## 2023-09-01 NOTE — Progress Notes (Addendum)
 28 y.o. GYN presents for AEX/STD screening. C/o bleeding after sex.  Needs refills on her DEPO.  Date last pap: 06/09/2021. Last Depo-Provera: 06/07/2023. Side Effects if any: NONE. Serum HCG indicated? NA. Depo-Provera 150 mg IM given by: Roseanna Cone, RMA. Given in LUOQ, tolerated well  Next appointment due July 3-17, 2025.   Administrations This Visit     medroxyPROGESTERone (DEPO-PROVERA) injection 150 mg     Admin Date 09/01/2023 Action Given Dose 150 mg Route Intramuscular Documented By Mancel Seashore, RMA

## 2023-09-01 NOTE — Addendum Note (Signed)
 Addended by: Earvin Blazier J on: 09/01/2023 10:48 AM   Modules accepted: Orders

## 2023-09-02 LAB — CERVICOVAGINAL ANCILLARY ONLY
Bacterial Vaginitis (gardnerella): POSITIVE — AB
Candida Glabrata: NEGATIVE
Candida Vaginitis: NEGATIVE
Chlamydia: NEGATIVE
Comment: NEGATIVE
Comment: NEGATIVE
Comment: NEGATIVE
Comment: NEGATIVE
Comment: NEGATIVE
Comment: NORMAL
Neisseria Gonorrhea: NEGATIVE
Trichomonas: NEGATIVE

## 2023-09-02 LAB — HEPATITIS C ANTIBODY: Hep C Virus Ab: NONREACTIVE

## 2023-09-02 LAB — CYTOLOGY - PAP: Diagnosis: HIGH — AB

## 2023-09-02 LAB — HIV ANTIBODY (ROUTINE TESTING W REFLEX): HIV Screen 4th Generation wRfx: NONREACTIVE

## 2023-09-02 LAB — RPR: RPR Ser Ql: NONREACTIVE

## 2023-09-02 LAB — HEPATITIS B SURFACE ANTIGEN: Hepatitis B Surface Ag: NEGATIVE

## 2023-09-06 ENCOUNTER — Other Ambulatory Visit: Payer: Self-pay | Admitting: Obstetrics and Gynecology

## 2023-09-06 DIAGNOSIS — B9689 Other specified bacterial agents as the cause of diseases classified elsewhere: Secondary | ICD-10-CM

## 2023-09-06 MED ORDER — METRONIDAZOLE 0.75 % VA GEL
1.0000 | Freq: Every day | VAGINAL | 1 refills | Status: DC
Start: 2023-09-06 — End: 2023-12-06

## 2023-09-29 ENCOUNTER — Telehealth: Payer: Self-pay

## 2023-09-29 NOTE — Telephone Encounter (Signed)
 Returned call, no answer, left vm

## 2023-10-06 ENCOUNTER — Ambulatory Visit: Admitting: Obstetrics and Gynecology

## 2023-10-06 ENCOUNTER — Other Ambulatory Visit (HOSPITAL_COMMUNITY)
Admission: RE | Admit: 2023-10-06 | Discharge: 2023-10-06 | Disposition: A | Discharge status: Home / Self Care | Source: Ambulatory Visit | Attending: Obstetrics and Gynecology | Admitting: Obstetrics and Gynecology

## 2023-10-06 VITALS — BP 128/89 | HR 111 | Ht 65.0 in | Wt 152.0 lb

## 2023-10-06 DIAGNOSIS — Z3202 Encounter for pregnancy test, result negative: Secondary | ICD-10-CM | POA: Diagnosis not present

## 2023-10-06 DIAGNOSIS — R87613 High grade squamous intraepithelial lesion on cytologic smear of cervix (HGSIL): Secondary | ICD-10-CM | POA: Insufficient documentation

## 2023-10-06 DIAGNOSIS — N870 Mild cervical dysplasia: Secondary | ICD-10-CM | POA: Diagnosis not present

## 2023-10-06 NOTE — Progress Notes (Signed)
    GYNECOLOGY OFFICE COLPOSCOPY PROCEDURE NOTE  28 y.o. G1P1001 here for colposcopy for HSIL pap smear on 09/01/2023. Discussed role for HPV in cervical dysplasia, need for surveillance. 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 Patient gave informed written consent, time out was performed.  Placed in lithotomy position. Cervix viewed with speculum and colposcope after application of acetic acid.   Colposcopy adequate? Yes  acetowhite lesion(s) noted at 10 and 12 o'clock; corresponding biopsies obtained.  ECC specimen obtained. All specimens were labeled and sent to pathology.  Chaperone was present during entire procedure.  Patient was given post procedure instructions.  Will follow up pathology and manage accordingly; patient will be contacted with results and recommendations.    Reviewed that patient will require LEEP/cone either in the office or the operating room. Her cervix is rather small and with her previous LEEP, she may be better served in the operating room. Will await biopsy results and finalize plan.  Marci Setter, MD, FACOG Obstetrician & Gynecologist, [facility] for [facility], [facility]

## 2023-10-06 NOTE — Progress Notes (Signed)
 28 y.o. 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

## 2023-10-11 ENCOUNTER — Telehealth: Payer: Self-pay | Admitting: Obstetrics and Gynecology

## 2023-10-11 DIAGNOSIS — R87613 High grade squamous intraepithelial lesion on cytologic smear of cervix (HGSIL): Secondary | ICD-10-CM

## 2023-10-11 NOTE — Telephone Encounter (Signed)
 Telephone call to patient. 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 Recommend diagnostic excision procedure. Patient with history of LEEP and her cervix was noted to be small. Discussed clinic vs OR and will proceed with LEEP in the OR. She verbalized her understanding. All questions answered. Surgery request placed.

## 2023-10-12 ENCOUNTER — Telehealth: Payer: Self-pay

## 2023-10-12 ENCOUNTER — Ambulatory Visit: Payer: Self-pay | Admitting: Obstetrics and Gynecology

## 2023-10-12 LAB — SURGICAL PATHOLOGY

## 2023-10-12 NOTE — Telephone Encounter (Signed)
 I called patient to see if she's available for surgery w/ Dr. Alto Atta on 12/06/23. Patient is available and would like surgery scheduled at 10:45 am at [facility]. I provided pre-op instructions and surgery details by phone.

## 2023-10-13 DIAGNOSIS — F900 Attention-deficit hyperactivity disorder, predominantly inattentive type: Secondary | ICD-10-CM | POA: Diagnosis not present

## 2023-10-13 DIAGNOSIS — F330 Major depressive disorder, recurrent, mild: Secondary | ICD-10-CM | POA: Diagnosis not present

## 2023-10-13 DIAGNOSIS — F411 Generalized anxiety disorder: Secondary | ICD-10-CM | POA: Diagnosis not present

## 2023-11-24 ENCOUNTER — Ambulatory Visit

## 2023-11-24 VITALS — BP 130/85 | HR 128

## 2023-11-24 DIAGNOSIS — Z3042 Encounter for surveillance of injectable contraceptive: Secondary | ICD-10-CM

## 2023-11-24 MED ORDER — MEDROXYPROGESTERONE ACETATE 150 MG/ML IM SUSP
150.0000 mg | Freq: Once | INTRAMUSCULAR | Status: AC
Start: 2023-11-24 — End: 2023-11-24
  Administered 2023-11-24: 150 mg via INTRAMUSCULAR

## 2023-11-24 NOTE — Progress Notes (Signed)
 Date last pap: 09/01/23. Last Depo-Provera : 08/29/23. Side Effects if any: N/A. Serum HCG indicated? N/A. Depo-Provera  150 mg IM given by: Margy Pizza, RN in [phone]. Next appointment due September 25- February 23, 2024.

## 2023-11-30 ENCOUNTER — Encounter (HOSPITAL_COMMUNITY): Payer: Self-pay | Admitting: Obstetrics and Gynecology

## 2023-11-30 NOTE — Progress Notes (Signed)
 Spoke w/ via phone for pre-op interview--- Debbie Coleman needs dos---- UPT per anesthesia. Surgeon orders requested 11/30/23.        Coleman results------ COVID test -----patient states asymptomatic no test needed Arrive at -------1000 NPO after MN NO Solid Food.  Clear liquids from MN until---0900 Pre-Surgery Ensure or G2:  Med rec completed Medications to take morning of surgery -----NONE Diabetic medication -----  GLP1 agonist last dose: GLP1 instructions:  Patient instructed no nail polish to be worn day of surgery Patient instructed to bring photo id and insurance card day of surgery Patient aware to have Driver (ride ) / caregiver    for 24 hours after surgery - Fiance [name] Patient Special Instructions ----- Shower with antibacterial soap. Pre-Op special Instructions -----  Patient verbalized understanding of instructions that were given at this phone interview. Patient denies chest pain, sob, fever, cough at the interview.

## 2023-12-06 ENCOUNTER — Encounter (HOSPITAL_COMMUNITY)
Admission: RE | Disposition: A | Discharge status: Home / Self Care | Payer: Self-pay | Source: Home / Self Care | Attending: Obstetrics and Gynecology

## 2023-12-06 ENCOUNTER — Encounter (HOSPITAL_COMMUNITY): Payer: Self-pay | Admitting: Obstetrics and Gynecology

## 2023-12-06 ENCOUNTER — Ambulatory Visit (HOSPITAL_COMMUNITY): Payer: Self-pay | Admitting: Anesthesiology

## 2023-12-06 ENCOUNTER — Other Ambulatory Visit: Payer: Self-pay

## 2023-12-06 ENCOUNTER — Ambulatory Visit (HOSPITAL_COMMUNITY)
Admission: RE | Admit: 2023-12-06 | Discharge: 2023-12-06 | Disposition: A | Discharge status: Home / Self Care | Attending: Obstetrics and Gynecology | Admitting: Obstetrics and Gynecology

## 2023-12-06 DIAGNOSIS — R87613 High grade squamous intraepithelial lesion on cytologic smear of cervix (HGSIL): Secondary | ICD-10-CM | POA: Insufficient documentation

## 2023-12-06 DIAGNOSIS — N319 Neuromuscular dysfunction of bladder, unspecified: Secondary | ICD-10-CM | POA: Diagnosis not present

## 2023-12-06 DIAGNOSIS — N871 Moderate cervical dysplasia: Secondary | ICD-10-CM | POA: Insufficient documentation

## 2023-12-06 DIAGNOSIS — N870 Mild cervical dysplasia: Secondary | ICD-10-CM | POA: Insufficient documentation

## 2023-12-06 DIAGNOSIS — F909 Attention-deficit hyperactivity disorder, unspecified type: Secondary | ICD-10-CM | POA: Insufficient documentation

## 2023-12-06 DIAGNOSIS — Z01818 Encounter for other preprocedural examination: Secondary | ICD-10-CM

## 2023-12-06 DIAGNOSIS — F17210 Nicotine dependence, cigarettes, uncomplicated: Secondary | ICD-10-CM | POA: Insufficient documentation

## 2023-12-06 HISTORY — PX: LEEP: SHX91

## 2023-12-06 LAB — POCT PREGNANCY, URINE: Preg Test, Ur: NEGATIVE

## 2023-12-06 SURGERY — LEEP (LOOP ELECTROSURGICAL EXCISION PROCEDURE)
Anesthesia: General

## 2023-12-06 MED ORDER — DOCUSATE SODIUM 100 MG PO CAPS: 100.0000 mg | ORAL_CAPSULE | Freq: Two times a day (BID) | ORAL | 0 refills | Status: DC

## 2023-12-06 MED ORDER — MIDAZOLAM HCL 2 MG/2ML IJ SOLN
INTRAMUSCULAR | Status: DC | PRN
  Administered 2023-12-06: 2 mg via INTRAVENOUS

## 2023-12-06 MED ORDER — ONDANSETRON HCL 4 MG/2ML IJ SOLN
INTRAMUSCULAR | Status: AC
Start: 2023-12-06 — End: 2023-12-06
  Filled 2023-12-06: qty 2

## 2023-12-06 MED ORDER — LIDOCAINE-EPINEPHRINE 1 %-1:100000 IJ SOLN
INTRAMUSCULAR | Status: DC | PRN
  Administered 2023-12-06: 10 mL

## 2023-12-06 MED ORDER — ACETAMINOPHEN 500 MG PO TABS
1000.0000 mg | ORAL_TABLET | ORAL | Status: AC
  Administered 2023-12-06: 1000 mg via ORAL

## 2023-12-06 MED ORDER — IBUPROFEN 600 MG PO TABS: 600.0000 mg | ORAL_TABLET | Freq: Four times a day (QID) | ORAL | 0 refills | Status: AC | PRN

## 2023-12-06 MED ORDER — DEXAMETHASONE SODIUM PHOSPHATE 10 MG/ML IJ SOLN
INTRAMUSCULAR | Status: AC
  Filled 2023-12-06: qty 1

## 2023-12-06 MED ORDER — ONDANSETRON HCL 4 MG/2ML IJ SOLN
INTRAMUSCULAR | Status: DC | PRN
  Administered 2023-12-06: 4 mg via INTRAVENOUS

## 2023-12-06 MED ORDER — IODINE STRONG (LUGOLS) 5 % PO SOLN
ORAL | Status: AC
  Filled 2023-12-06: qty 1

## 2023-12-06 MED ORDER — PROPOFOL 10 MG/ML IV BOLUS
INTRAVENOUS | Status: DC | PRN
  Administered 2023-12-06: 170 mg via INTRAVENOUS

## 2023-12-06 MED ORDER — DEXAMETHASONE SODIUM PHOSPHATE 10 MG/ML IJ SOLN
INTRAMUSCULAR | Status: DC | PRN
  Administered 2023-12-06: 10 mg via INTRAVENOUS

## 2023-12-06 MED ORDER — MIDAZOLAM HCL 2 MG/2ML IJ SOLN
INTRAMUSCULAR | Status: AC
Start: 2023-12-06 — End: 2023-12-06
  Filled 2023-12-06: qty 2

## 2023-12-06 MED ORDER — IODINE STRONG (LUGOLS) 5 % PO SOLN
ORAL | Status: DC | PRN
  Administered 2023-12-06: 14 mL via ORAL

## 2023-12-06 MED ORDER — MONSELS FERRIC SUBSULFATE EX SOLN
CUTANEOUS | Status: AC
  Filled 2023-12-06: qty 8

## 2023-12-06 MED ORDER — LIDOCAINE 2% (20 MG/ML) 5 ML SYRINGE
INTRAMUSCULAR | Status: AC
  Filled 2023-12-06: qty 5

## 2023-12-06 MED ORDER — ACETAMINOPHEN 500 MG PO TABS
ORAL_TABLET | ORAL | Status: AC
  Filled 2023-12-06: qty 2

## 2023-12-06 MED ORDER — CHLORHEXIDINE GLUCONATE 0.12 % MT SOLN
15.0000 mL | Freq: Once | OROMUCOSAL | Status: AC
  Administered 2023-12-06: 15 mL via OROMUCOSAL

## 2023-12-06 MED ORDER — ORAL CARE MOUTH RINSE: 15.0000 mL | Freq: Once | OROMUCOSAL | Status: AC

## 2023-12-06 MED ORDER — LACTATED RINGERS IV SOLN: INTRAVENOUS | Status: DC

## 2023-12-06 MED ORDER — FENTANYL CITRATE (PF) 100 MCG/2ML IJ SOLN
INTRAMUSCULAR | Status: DC | PRN
  Administered 2023-12-06 (×2): 50 ug via INTRAVENOUS

## 2023-12-06 MED ORDER — KETOROLAC TROMETHAMINE 30 MG/ML IJ SOLN
INTRAMUSCULAR | Status: DC | PRN
  Administered 2023-12-06: 30 mg via INTRAVENOUS

## 2023-12-06 MED ORDER — LIDOCAINE-EPINEPHRINE 1 %-1:100000 IJ SOLN
INTRAMUSCULAR | Status: AC
  Filled 2023-12-06: qty 1

## 2023-12-06 MED ORDER — MONSELS FERRIC SUBSULFATE EX SOLN
CUTANEOUS | Status: DC | PRN
  Administered 2023-12-06: 1 via TOPICAL

## 2023-12-06 MED ORDER — ONDANSETRON HCL 4 MG/2ML IJ SOLN: 4.0000 mg | Freq: Once | INTRAMUSCULAR | Status: DC | PRN

## 2023-12-06 MED ORDER — ACETIC ACID 5 % SOLN
Status: AC
  Filled 2023-12-06: qty 24

## 2023-12-06 MED ORDER — LIDOCAINE HCL (CARDIAC) PF 100 MG/5ML IV SOSY
PREFILLED_SYRINGE | INTRAVENOUS | Status: DC | PRN
  Administered 2023-12-06: 100 mg via INTRAVENOUS

## 2023-12-06 MED ORDER — CHLORHEXIDINE GLUCONATE 0.12 % MT SOLN
OROMUCOSAL | Status: DC
Start: 2023-12-06 — End: 2023-12-06
  Filled 2023-12-06: qty 15

## 2023-12-06 MED ORDER — PROPOFOL 10 MG/ML IV BOLUS
INTRAVENOUS | Status: AC
  Filled 2023-12-06: qty 20

## 2023-12-06 MED ORDER — FENTANYL CITRATE (PF) 100 MCG/2ML IJ SOLN: 25.0000 ug | INTRAMUSCULAR | Status: DC | PRN

## 2023-12-06 MED ORDER — AMISULPRIDE (ANTIEMETIC) 5 MG/2ML IV SOLN
10.0000 mg | Freq: Once | INTRAVENOUS | Status: DC | PRN
Start: 2023-12-06 — End: 2023-12-06

## 2023-12-06 MED ORDER — FENTANYL CITRATE (PF) 250 MCG/5ML IJ SOLN
INTRAMUSCULAR | Status: AC
  Filled 2023-12-06: qty 5

## 2023-12-06 SURGICAL SUPPLY — 21 items
BLADE SURG 11 STRL SS (BLADE) IMPLANT
ELECT BALL LEEP 3MM BLK (ELECTRODE) IMPLANT
ELECT BALL LEEP 5MM RED (ELECTRODE) IMPLANT
ELECTRODE LOOP LP RND 10X10YLW (CUTTING LOOP) IMPLANT
ELECTRODE LOOP LP RND 15X12GRN (CUTTING LOOP) IMPLANT
ELECTRODE LOOP LP RND 20X12WHT (CUTTING LOOP) IMPLANT
ELECTRODE LOOP LP SQR 10X10ORG (CUTTING LOOP) IMPLANT
EXTENDER ELECT LOOP LEEP 10CM (CUTTING LOOP) IMPLANT
GLOVE BIO SURGEON STRL SZ 6.5 (GLOVE) ×1 IMPLANT
GLOVE BIOGEL PI IND STRL 6.5 (GLOVE) ×1 IMPLANT
GOWN STRL REUS W/ TWL LRG LVL3 (GOWN DISPOSABLE) ×1 IMPLANT
PACK VAGINAL MINOR WOMEN LF (CUSTOM PROCEDURE TRAY) IMPLANT
PACK VAGINAL WOMENS (CUSTOM PROCEDURE TRAY) ×1 IMPLANT
PAD OB MATERNITY 11 LF (PERSONAL CARE ITEMS) ×1 IMPLANT
PENCIL SMOKE EVACUATOR (MISCELLANEOUS) IMPLANT
SCOPETTES 8 STERILE (MISCELLANEOUS) ×1 IMPLANT
SLEEVE SCD COMPRESS KNEE MED (STOCKING) ×1 IMPLANT
SUT VIC AB 0 CT1 27XBRD ANBCTR (SUTURE) ×1 IMPLANT
TOWEL GREEN STERILE (TOWEL DISPOSABLE) ×1 IMPLANT
TUBING BULK SUCTION (MISCELLANEOUS) IMPLANT
YANKAUER SUCT BULB TIP NO VENT (SUCTIONS) IMPLANT

## 2023-12-06 NOTE — Op Note (Signed)
 Hanley Vonda Harth PROCEDURE DATE: 12/06/2023  PREOPERATIVE DIAGNOSIS: HSIL pap  POSTOPERATIVE DIAGNOSIS: Same  PROCEDURE: LEEP, ECC  SURGEON:  Dr. Rollo T. Miriana Gaertner, MD  INDICATIONS: 28 y.o. G1P1001 with HSIL on pap and CIN1 on colpo biopsies. She was counseled on LEEP in the OR due to smaller cervix and previous LEEP. Her pap smear history is as follows:  09/2023: CIN1 on colpo biopsies and ECC 08/2023: HSIL 05/2021: Benign reactive/reparative changes 03/2020: LEEP with CIN2, neg margins, ECC neg 02/2020: CIN2, CIN1 on colpo biopsies, ECC neg 01/2020: LSIL 08/2017: NILM 11/2018: NILM  FINDINGS:  Small cervix with scant areas near transformation zone that were nonstaining after application of Lugol's. Cervix hemostatic at completion of procedure.  ANESTHESIA: General with LMA, intracervical block  INTRAVENOUS FLUIDS:  500 ml    ESTIMATED BLOOD LOSS:  1 ml  SPECIMENS:  Anterior and posterior portion of cervix from LEEP, endocervical curettage  COMPLICATIONS:  None immediate.  PROCEDURE DETAILS:    The patient was taken to the operating room. Anesthesia was induced without complication. She was positioned in lithotomy. A timeout was performed to confirm the correct patient and procedure. A bivalved coated speculum was placed in the patient's vagina. A grounding pad placed on the patient. Lugol's was applied to the cervix. Local anesthesia was administered via an intracervical block using 10 ml of 1% Lidocaine  with epinephrine . The suction was turned on and the UtahLoop 10mm x 10 mm round loop was used on 35 watts of blended current to excise the entire transformation zone to an approximate depth of 5 mm. An anterior portion was excised followed by a posterior portion. Endocervical curettage was performed. Excellent hemostasis was achieved using roller ball coagulation set at 35 Watts coagulation current. Monsel's solution was then applied and the speculum was removed from the vagina.  Specimens were sent to pathology.  Sponge and instrument counts were correct times two.  The patient tolerated the procedure well and was taken to the recovery area extubated, awake, and in stable condition.  Rollo ONEIDA Bring, MD, FACOG Obstetrician & Gynecologist, [facility] for [facility], [facility]

## 2023-12-06 NOTE — Anesthesia Preprocedure Evaluation (Addendum)
 Anesthesia Evaluation  Patient identified by MRN, date of birth, ID band Patient awake    Reviewed: Allergy & Precautions, NPO status , Patient's Chart, lab work & pertinent test results  Airway Mallampati: II  TM Distance: >3 FB Neck ROM: Full    Dental  (+) Teeth Intact, Dental Advisory Given   Pulmonary Current Smoker and Patient abstained from smoking.   Pulmonary exam normal breath sounds clear to auscultation       Cardiovascular Exercise Tolerance: Good negative cardio ROS Normal cardiovascular exam Rhythm:Regular Rate:Normal     Neuro/Psych  Headaches PSYCHIATRIC DISORDERS Anxiety Depression       GI/Hepatic negative GI ROS, Neg liver ROS,,,  Endo/Other  negative endocrine ROS    Renal/GU negative Renal ROS Bladder dysfunction      Musculoskeletal negative musculoskeletal ROS (+)    Abdominal   Peds  (+) ADHD Hematology negative hematology ROS (+)   Anesthesia Other Findings Day of surgery medications reviewed with the patient.  Reproductive/Obstetrics Dysplasia of cervix, low grade                              Anesthesia Physical Anesthesia Plan  ASA: 2  Anesthesia Plan: General   Post-op Pain Management: Tylenol  PO (pre-op)*   Induction: Intravenous  PONV Risk Score and Plan: 3 and Midazolam , Dexamethasone  and Ondansetron   Airway Management Planned: LMA  Additional Equipment:   Intra-op Plan:   Post-operative Plan: Extubation in OR  Informed Consent: I have reviewed the patients History and Physical, chart, labs and discussed the procedure including the risks, benefits and alternatives for the proposed anesthesia with the patient or authorized representative who has indicated his/her understanding and acceptance.     Dental advisory given  Plan Discussed with: CRNA  Anesthesia Plan Comments:          Anesthesia Quick Evaluation

## 2023-12-06 NOTE — Transfer of Care (Signed)
 Immediate Anesthesia Transfer of Care Note  Patient: Debbie Coleman  Procedure(s) Performed: LEEP (LOOP ELECTROSURGICAL EXCISION PROCEDURE) ENDOCERVICAL CURRETAGE  Patient Location: PACU  Anesthesia Type:General  Level of Consciousness: awake  Airway & Oxygen Therapy: Patient Spontanous Breathing and Patient connected to face mask oxygen  Post-op Assessment: Report given to RN and Post -op Vital signs reviewed and stable  Post vital signs: Reviewed and stable  Last Vitals:  Vitals Value Taken Time  BP 125/87 12/06/23 14:15  Temp    Pulse 96 12/06/23 14:16  Resp 21 12/06/23 14:16  SpO2 100 % 12/06/23 14:16  Vitals shown include unfiled device data.  Last Pain:  Vitals:   12/06/23 1018  TempSrc: Oral  PainSc: 0-No pain      Patients Stated Pain Goal: 5 (12/06/23 1018)  Complications: No notable events documented.

## 2023-12-06 NOTE — Anesthesia Postprocedure Evaluation (Signed)
 Anesthesia Post Note  Patient: Debbie Coleman  Procedure(s) Performed: LEEP (LOOP ELECTROSURGICAL EXCISION PROCEDURE) ENDOCERVICAL CURRETAGE     Patient location during evaluation: PACU Anesthesia Type: General Level of consciousness: awake and alert Pain management: pain level controlled Vital Signs Assessment: post-procedure vital signs reviewed and stable Respiratory status: spontaneous breathing, nonlabored ventilation and respiratory function stable Cardiovascular status: blood pressure returned to baseline and stable Postop Assessment: no apparent nausea or vomiting Anesthetic complications: no   No notable events documented.  Last Vitals:  Vitals:   12/06/23 1415 12/06/23 1445  BP: 125/87 (!) 114/94  Pulse: 92 (!) 56  Resp: 16 12  Temp: 36.7 C   SpO2: 100% 100%    Last Pain:  Vitals:   12/06/23 1445  TempSrc:   PainSc: 0-No pain                 Garnette FORBES Skillern

## 2023-12-06 NOTE — H&P (Signed)
 Preoperative History and Physical  Debbie Coleman is a 28 y.o. G1P1001 here for surgical management of HSIL pap.   No significant preoperative concerns.  Doing well today. Ready for the procedure. Has had one prior LEEP. Denies chest pain, shortness of breath, sleep apnea/snoring, or any difficulty with daily activities.  Proposed surgery: Loop electrosurgical excision procedure (LEEP), endocervical curettage (ECC)  Pap history: 09/2023: CIN1 on colpo biopsies and ECC 08/2023: HSIL 05/2021: Benign reactive/reparative changes 03/2020: LEEP with CIN2, neg margins, ECC neg 02/2020: CIN2, CIN1 on colpo biopsies, ECC neg 01/2020: LSIL 08/2017: NILM 11/2018: NILM  Past Medical History:  Diagnosis Date   ADHD (attention deficit hyperactivity disorder)    Chronic cystitis 03/05/2013   Chronic low back pain 03/02/2016   Depression    Depression with anxiety 09/28/2014   Family history of breast cancer 08/2017   qualifies for updated genetic testing, may wait till older   Foot fracture, left 01/31/2020   Migraines 01/31/2020   Past Surgical History:  Procedure Laterality Date   FOOT SURGERY Left 09/2004   compound fracture of left foot-had open reduction and a second surgery to remove hardware.   WISDOM TOOTH EXTRACTION  age 42   OB History  Gravida Para Term Preterm AB Living  1 1 1  0 0 1  SAB IAB Ectopic Multiple Live Births  0 0 0 0 1    # Outcome Date GA Lbr Len/2nd Weight Sex Type Anes PTL Lv  1 Term 01/28/23 [redacted]w[redacted]d  3870 g F Vag-Spont EPI  LIV     Birth Comments: WDL  Patient denies any other pertinent gynecologic issues.   No current facility-administered medications on file prior to encounter.   Current Outpatient Medications on File Prior to Encounter  Medication Sig Dispense Refill   acetaminophen  (TYLENOL ) 650 MG CR tablet Take 650 mg by mouth every 8 (eight) hours as needed for pain.     naproxen sodium (ALEVE) 220 MG tablet Take 220 mg by mouth daily as  needed (pain/headache).     VYVANSE 40 MG capsule Take 40 mg by mouth every morning.     cetirizine (ZYRTEC) 10 MG tablet Take 10 mg by mouth daily. (Patient not taking: Reported on 11/24/2023)     ferrous sulfate  325 (65 FE) MG EC tablet Take 1 tablet (325 mg total) by mouth daily with breakfast. (Patient not taking: Reported on 11/24/2023) 90 tablet 3   medroxyPROGESTERone  (DEPO-PROVERA ) 150 MG/ML injection Inject 1 mL (150 mg total) into the muscle every 3 (three) months. 1 mL 2   medroxyPROGESTERone  (DEPO-PROVERA ) 150 MG/ML injection Inject 1 mL (150 mg total) into the muscle every 3 (three) months. 1 mL 4   metroNIDAZOLE  (METROGEL ) 0.75 % vaginal gel Place 1 Applicatorful vaginally at bedtime. Apply one applicatorful to vagina at bedtime for 5 days (Patient not taking: Reported on 11/24/2023) 70 g 1   Prenatal MV & Min w/FA-DHA (PRENATAL ADULT GUMMY/DHA/FA) 0.4-25 MG CHEW Chew 1 tablet by mouth daily. (Patient not taking: Reported on 11/24/2023) 90 tablet 3   senna-docusate (SENOKOT-S) 8.6-50 MG tablet Take 2 tablets by mouth 2 (two) times daily as needed for mild constipation. (Patient not taking: Reported on 11/24/2023) 30 tablet 0   No Known Allergies  Social History:   reports that she has been smoking cigarettes. She has never used smokeless tobacco. She reports current alcohol use. She reports that she does not currently use drugs after having used the following drugs: Marijuana.  Family History  Problem Relation Age of Onset   Anxiety disorder Mother    Healthy Father    Other Maternal Grandmother        acquired absence of kidney   Breast cancer Maternal Grandmother 65       BRCA negative not on tamoxifen after unknown estrogen   Hypertension Maternal Grandmother    Healthy Maternal Grandfather    Kidney failure Other    Healthy Half-Sister    Healthy Half-Sister    Healthy Half-Brother    Healthy Half-Brother    Healthy Half-Brother    Healthy Half-Brother     Review of  Systems: Pertinent items noted in HPI and remainder of comprehensive ROS otherwise negative.  PHYSICAL EXAM: Blood pressure (P) 116/88, pulse (P) 79, temperature (P) 98.1 F (36.7 C), temperature source (P) Oral, resp. rate (P) 17, height (P) 5' 5 (1.651 m), weight (P) 68 kg, SpO2 (P) 97%, not currently breastfeeding. CONSTITUTIONAL: Well-developed, well-nourished female in no acute distress.  HENT:  Normocephalic, atraumatic, External right and left ear normal. Oropharynx is clear and moist EYES: Conjunctivae and EOM are normal. Pupils are equal, round, and reactive to light. No scleral icterus.  NECK: Normal range of motion, supple, no masses SKIN: Skin is warm and dry. No rash noted. Not diaphoretic. No erythema. No pallor. NEUROLOGIC: Alert and oriented to person, place, and time. Normal reflexes, muscle tone coordination. No cranial nerve deficit noted. PSYCHIATRIC: Normal mood and affect. Normal behavior. Normal judgment and thought content. CARDIOVASCULAR: Normal heart rate noted, regular rhythm RESPIRATORY: Effort and breath sounds normal, no problems with respiration noted ABDOMEN: Soft, nontender, nondistended. PELVIC: Deferred MUSCULOSKELETAL: Normal range of motion. No edema and no tenderness.    Labs: No results found for this or any previous visit (from the past 2 weeks).  Imaging Studies: No results found.  Assessment: Active Problems:   HSIL (high grade squamous intraepithelial lesion) on Pap smear of cervix   Plan: To OR for Loop electrosurgical excision procedure (LEEP), endocervical curettage (ECC)  Risks, benefits, alternatives, and limitations of procedure explained to patient, including pain, bleeding, abnormal discharge, infection, failure to remove abnormal tissue and failure to cure dysplasia, need for repeat procedures, damage to pelvic organs including bowel or bladder, cervical incompetence with implications for future pregnancies, risks of anesthesia  including venous thromboembolism, myocardial infarction, stroke or death.  All questions were answered.   Rollo ONEIDA Bring, MD, FACOG Obstetrician & Gynecologist, [facility] for [facility], [facility]

## 2023-12-06 NOTE — Anesthesia Procedure Notes (Signed)
 Procedure Name: LMA Insertion Date/Time: 12/06/2023 1:29 PM  Performed by: Mathew Bernardino RAMAN, RNPre-anesthesia Checklist: Patient identified, Patient being monitored, Emergency Drugs available, Timeout performed and Suction available Patient Re-evaluated:Patient Re-evaluated prior to induction Oxygen Delivery Method: Circle System Utilized Preoxygenation: Pre-oxygenation with 100% oxygen Induction Type: IV induction Ventilation: Mask ventilation without difficulty LMA: LMA inserted LMA Size: 4.0 Number of attempts: 1 Placement Confirmation: positive ETCO2 and breath sounds checked- equal and bilateral

## 2023-12-06 NOTE — Discharge Instructions (Addendum)
 DO NOT TAKE TYLENOL  UNTIL AFTER 4:30 PM DO NOT TAKE MOTRIN  UNTIL AFTER 7:45 PM   Post Anesthesia Home Care Instructions  Activity: Get plenty of rest for the remainder of the day. A responsible adult should stay with you for 24 hours following the procedure.  For the next 24 hours, DO NOT: -Drive a car -Advertising copywriter -Drink alcoholic beverages -Take any medication unless instructed by your physician -Make any legal decisions or sign important papers.  Meals: Start with liquid foods such as gelatin or soup. Progress to regular foods as tolerated. Avoid greasy, spicy, heavy foods. If nausea and/or vomiting occur, drink only clear liquids until the nausea and/or vomiting subsides. Call your physician if vomiting continues.  Special Instructions/Symptoms: Your throat may feel dry or sore from the anesthesia or the breathing tube placed in your throat during surgery. If this causes discomfort, gargle with warm salt water. The discomfort should disappear within 24 hours.  If you had a scopolamine patch placed behind your ear for the management of post- operative nausea and/or vomiting:  1. The medication in the patch is effective for 72 hours, after which it should be removed.  Wrap patch in a tissue and discard in the trash. Wash hands thoroughly with soap and water. 2. You may remove the patch earlier than 72 hours if you experience unpleasant side effects which may include dry mouth, dizziness or visual disturbances. 3. Avoid touching the patch. Wash your hands with soap and water after contact with the patch.  Call your surgeon if you experience:   1.  Fever over 101.0. 2.  Inability to urinate. 3.  Nausea and/or vomiting. 4.  Extreme swelling or bruising at the surgical site. 5.  Continued bleeding from the incision. 6.  Increased pain, redness or drainage from the incision. 7.  Problems related to your pain medication. 8. Any change in color, movement and/or sensation 9. Any  problems and/or concerns

## 2023-12-07 ENCOUNTER — Encounter (HOSPITAL_COMMUNITY): Payer: Self-pay | Admitting: Obstetrics and Gynecology

## 2023-12-07 LAB — SURGICAL PATHOLOGY

## 2023-12-08 ENCOUNTER — Ambulatory Visit: Payer: Self-pay | Admitting: Obstetrics and Gynecology

## 2024-01-04 ENCOUNTER — Encounter: Admitting: Obstetrics and Gynecology

## 2024-01-09 ENCOUNTER — Ambulatory Visit (HOSPITAL_COMMUNITY): Payer: Self-pay

## 2024-01-09 ENCOUNTER — Encounter (HOSPITAL_COMMUNITY): Payer: Self-pay | Admitting: Emergency Medicine

## 2024-01-09 ENCOUNTER — Ambulatory Visit (HOSPITAL_COMMUNITY)
Admission: EM | Admit: 2024-01-09 | Discharge: 2024-01-09 | Disposition: A | Discharge status: Home / Self Care | Attending: Emergency Medicine | Admitting: Emergency Medicine

## 2024-01-09 ENCOUNTER — Ambulatory Visit (INDEPENDENT_AMBULATORY_CARE_PROVIDER_SITE_OTHER)

## 2024-01-09 DIAGNOSIS — S0992XA Unspecified injury of nose, initial encounter: Secondary | ICD-10-CM

## 2024-01-09 NOTE — ED Triage Notes (Signed)
 Pt reports last Tuesday was working in yard and her dog hit her in the face when jumped up on her. Pt has black eye and nose pain. Pt believes nose is broken.

## 2024-01-09 NOTE — ED Provider Notes (Signed)
 [facility]    CSN: [id number] Arrival date & time: 01/09/24  1218      History   Chief Complaint Chief Complaint  Patient presents with   Facial Injury    HPI Debbie Coleman is a 28 y.o. female.   Patient presents with nose pain and bruising under her eyes after incident that occurred on 8/19.  Patient states that she was working in her yard when her dog was playing and jumped up on her and hit her face.  Patient states that she feels like her nose is broken from this incident.  Patient denies any other injuries from this incident.  The history is provided by the patient and medical records.  Facial Injury   Past Medical History:  Diagnosis Date   ADHD (attention deficit hyperactivity disorder)    Chronic cystitis 03/05/2013   Chronic low back pain 03/02/2016   Depression    Depression with anxiety 09/28/2014   Family history of breast cancer 08/2017   qualifies for updated genetic testing, may wait till older   Foot fracture, left 01/31/2020   Migraines 01/31/2020    Patient Active Problem List   Diagnosis Date Noted   HSIL (high grade squamous intraepithelial lesion) on Pap smear of cervix 12/06/2023   Postpartum care following vaginal delivery 03/01/2023   Vaginal delivery 01/28/2023   Post term pregnancy over 40 weeks 01/27/2023   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   LEEP N/A 12/06/2023   Procedure: LEEP (LOOP  ELECTROSURGICAL EXCISION PROCEDURE) ENDOCERVICAL CURRETAGE;  Surgeon: Abigail Rollo DASEN, MD;  Location: [facility];  Service: Gynecology;  Laterality: N/A;   WISDOM TOOTH EXTRACTION  age 37    OB History     Gravida  1   Para  1   Term  1   Preterm  0   AB  0   Living  1      SAB  0   IAB  0   Ectopic  0   Multiple  0   Live Births  1            Home Medications    Prior to Admission medications   Medication Sig Start Date End Date Taking? Authorizing Provider  acetaminophen  (TYLENOL ) 650 MG CR tablet Take 650 mg by mouth every 8 (eight) hours as needed for pain.    [provider]  ibuprofen  (ADVIL ) 600 MG tablet Take 1 tablet (600 mg total) by mouth every 6 (six) hours as needed. 12/06/23   Abigail Rollo DASEN, MD  medroxyPROGESTERone  (DEPO-PROVERA ) 150 MG/ML injection Inject 1 mL (150 mg total) into the muscle every 3 (three) months. 06/07/23   Zina Jerilynn LABOR, MD  VYVANSE 40 MG capsule Take 40 mg by mouth every morning. 08/11/23   [provider]    Family History Family History  Problem Relation Age of Onset   Anxiety disorder Mother    Healthy Father    Other Maternal Grandmother  acquired absence of kidney   Breast cancer Maternal Grandmother 45       BRCA negative not on tamoxifen after unknown estrogen   Hypertension Maternal Grandmother    Healthy Maternal Grandfather    Kidney failure Other    Healthy Half-Sister    Healthy Half-Sister    Healthy Half-Brother    Healthy Half-Brother    Healthy Half-Brother    Healthy Half-Brother     Social History Social History   Tobacco Use   Smoking status: Some Days    Types: Cigarettes   Smokeless tobacco: Never  Vaping Use   Vaping status: Former  Substance Use Topics   Alcohol use: Yes    Comment: 3 x per week   Drug use: Not Currently    Types: Marijuana     Allergies   Patient has no known allergies.   Review of Systems Review of Systems  Per HPI  Physical  Exam Triage Vital Signs ED Triage Vitals  Encounter Vitals Group     BP 01/09/24 1326 129/83     Girls Systolic BP Percentile --      Girls Diastolic BP Percentile --      Boys Systolic BP Percentile --      Boys Diastolic BP Percentile --      Pulse Rate 01/09/24 1326 78     Resp 01/09/24 1326 14     Temp 01/09/24 1326 98.3 F (36.8 C)     Temp Source 01/09/24 1326 Oral     SpO2 01/09/24 1326 98 %     Weight --      Height --      Head Circumference --      Peak Flow --      Pain Score 01/09/24 1325 3     Pain Loc --      Pain Education --      Exclude from Growth Chart --    No data found.  Updated Vital Signs BP 129/83 (BP Location: Left Arm)   Pulse 78   Temp 98.3 F (36.8 C) (Oral)   Resp 14   SpO2 98%   Visual Acuity Right Eye Distance:   Left Eye Distance:   Bilateral Distance:    Right Eye Near:   Left Eye Near:    Bilateral Near:     Physical Exam Vitals and nursing note reviewed.  Constitutional:      General: She is awake. She is not in acute distress.    Appearance: Normal appearance. She is well-developed and well-groomed. She is not ill-appearing.  HENT:     Nose: Nasal deformity, signs of injury and nasal tenderness present.  Skin:    General: Skin is warm and dry.  Neurological:     Mental Status: She is alert.  Psychiatric:        Behavior: Behavior is cooperative.      UC Treatments / Results  Labs (all labs ordered are listed, but only abnormal results are displayed) Labs Reviewed - No data to display  EKG   Radiology DG Nasal Bones Result Date: 01/09/2024 CLINICAL DATA:  Nasal pain following an injury six days ago. EXAM: NASAL BONES - 3+ VIEW COMPARISON:  Report dated 03/18/2016 FINDINGS: Mildly comminuted fracture of the distal nasal bone with 1 mm of depression of the distal fragment. The anterior maxillary spine is intact. IMPRESSION: Mildly comminuted fracture of the distal nasal bone with 1 mm of depression of the distal  fragment. Electronically Signed  By: Elspeth Bathe M.D.   On: 01/09/2024 14:43    Procedures Procedures (including critical care time)  Medications Ordered in UC Medications - No data to display  Initial Impression / Assessment and Plan / UC Course  I have reviewed the triage vital signs and the nursing notes.  Pertinent labs & imaging results that were available during my care of the patient were reviewed by me and considered in my medical decision making (see chart for details).     Patient is overall well-appearing.  Vitals are stable.  There appears to be an obvious deformity and displacement of the nasal bone to the left, and there is tenderness and mild swelling noted.  X-ray ordered.  Based on my interpretation there appears to be a fracture of the distal nasal bone.  Radiology report reveals mildly comminuted fracture of the distal nasal bone with 1 mm of depression of the distal fragment.  Given information for ENT to follow-up with regarding this injury.  Recommended Tylenol  and ibuprofen  as needed for pain.  Recommended continuing with ice to help decrease swelling and help with bruising.  Discussed follow-up and return precautions. Final Clinical Impressions(s) / UC Diagnoses   Final diagnoses:  Injury of nose, initial encounter     Discharge Instructions      As discussed we are unable to reset your nose as it has been almost a week since the initial injury. I have attached Dr. Karis who is an ear nose and throat doctor that you can follow-up with for further evaluation and management of this. Alternate between 650 mg of Tylenol  and 400 mg of ibuprofen  every 6-8 hours as needed for pain. You can apply ice to the area to help reduce swelling and help with bruising. Otherwise follow-up with your primary care provider or return here as needed.     ED Prescriptions   None    PDMP not reviewed this encounter.   Johnie Flaming A, NP 01/09/24 [phone]

## 2024-01-09 NOTE — Discharge Instructions (Signed)
 As discussed we are unable to reset your nose as it has been almost a week since the initial injury. I have attached Dr. Karis who is an ear nose and throat doctor that you can follow-up with for further evaluation and management of this. Alternate between 650 mg of Tylenol  and 400 mg of ibuprofen  every 6-8 hours as needed for pain. You can apply ice to the area to help reduce swelling and help with bruising. Otherwise follow-up with your primary care provider or return here as needed.

## 2024-01-11 ENCOUNTER — Ambulatory Visit (HOSPITAL_COMMUNITY)
Admission: RE | Admit: 2024-01-11 | Discharge: 2024-01-11 | Disposition: A | Discharge status: Home / Self Care | Source: Ambulatory Visit | Attending: Physician Assistant | Admitting: Physician Assistant

## 2024-01-11 ENCOUNTER — Encounter (INDEPENDENT_AMBULATORY_CARE_PROVIDER_SITE_OTHER): Payer: Self-pay | Admitting: Physician Assistant

## 2024-01-11 ENCOUNTER — Ambulatory Visit (INDEPENDENT_AMBULATORY_CARE_PROVIDER_SITE_OTHER): Admitting: Physician Assistant

## 2024-01-11 ENCOUNTER — Institutional Professional Consult (permissible substitution) (INDEPENDENT_AMBULATORY_CARE_PROVIDER_SITE_OTHER): Admitting: Physician Assistant

## 2024-01-11 VITALS — BP 141/90 | HR 88

## 2024-01-11 DIAGNOSIS — W541XXA Struck by dog, initial encounter: Secondary | ICD-10-CM

## 2024-01-11 DIAGNOSIS — S022XXD Fracture of nasal bones, subsequent encounter for fracture with routine healing: Secondary | ICD-10-CM | POA: Diagnosis present

## 2024-01-11 DIAGNOSIS — J342 Deviated nasal septum: Secondary | ICD-10-CM

## 2024-01-11 DIAGNOSIS — S022XXA Fracture of nasal bones, initial encounter for closed fracture: Secondary | ICD-10-CM | POA: Diagnosis not present

## 2024-01-11 NOTE — Progress Notes (Signed)
 Dear Dr. Rexford ref. provider found, Here is my assessment for our mutual patient, Debbie Coleman. Thank you for allowing me the opportunity to care for your patient. Please do not hesitate to contact me should you have any other questions. Sincerely, Chyrl Cohen PA-C  Otolaryngology Clinic Note Referring provider: Dr. Rexford ref. provider found HPI:  Debbie Coleman is a 28 y.o. female kindly referred by Dr. No ref. provider found   The patient is a 28 year old female seen in our office for evaluation of nasal bone fracture.  The patient notes that approximately 8 days ago she was in her yard when her dog jumped up and accidentally hit her in the nose.  She notes immediate pain.  She noted associated swelling.  She thought that the swelling would go down and everything would be okay, she noticed that once the swelling started to go down her nose was crooked and was having some obstruction on the left side.  She notes she can still breathe through the left side although obstructed when compared to previous, this is a new issue for her.  She denies any other facial pain, she did have some soreness in the forehead previously.    Independent Review of Additional Tests or Records:  Urgent care note on 01/09/2024-DG nasal bones on 01/09/2024     PMH/Meds/All/SocHx/FamHx/ROS:   Past Medical History:  Diagnosis Date   ADHD (attention deficit hyperactivity disorder)    Chronic cystitis 03/05/2013   Chronic low back pain 03/02/2016   Depression    Depression with anxiety 09/28/2014   Family history of breast cancer 08/2017   qualifies for updated genetic testing, may wait till older   Foot fracture, left 01/31/2020   Migraines 01/31/2020     Past Surgical History:  Procedure Laterality Date   FOOT SURGERY Left 09/2004   compound fracture of left foot-had open reduction and a second surgery to remove hardware.   LEEP N/A 12/06/2023   Procedure: LEEP (LOOP ELECTROSURGICAL EXCISION PROCEDURE)  ENDOCERVICAL CURRETAGE;  Surgeon: Abigail Rollo DASEN, MD;  Location: [facility] OR;  Service: Gynecology;  Laterality: N/A;   WISDOM TOOTH EXTRACTION  age 54    Family History  Problem Relation Age of Onset   Anxiety disorder Mother    Healthy Father    Other Maternal Grandmother        acquired absence of kidney   Breast cancer Maternal Grandmother 78       BRCA negative not on tamoxifen after unknown estrogen   Hypertension Maternal Grandmother    Healthy Maternal Grandfather    Kidney failure Other    Healthy Half-Sister    Healthy Half-Sister    Healthy Half-Brother    Healthy Half-Brother    Healthy Half-Brother    Healthy Half-Brother      Social Connections: Moderately Isolated (06/03/2022)   Social Connection and Isolation Panel    Frequency of Communication with Friends and Family: More than three times a week    Frequency of Social Gatherings with Friends and Family: Twice a week    Attends Religious Services: Never    Database administrator or Organizations: No    Attends Engineer, structural: Never    Marital Status: Living with partner      Current Outpatient Medications:    acetaminophen  (TYLENOL ) 650 MG CR tablet, Take 650 mg by mouth every 8 (eight) hours as needed for pain., Disp: , Rfl:    ibuprofen  (ADVIL ) 600 MG tablet, Take 1 tablet (  600 mg total) by mouth every 6 (six) hours as needed., Disp: 30 tablet, Rfl: 0   medroxyPROGESTERone  (DEPO-PROVERA ) 150 MG/ML injection, Inject 1 mL (150 mg total) into the muscle every 3 (three) months., Disp: 1 mL, Rfl: 2   VYVANSE 40 MG capsule, Take 40 mg by mouth every morning., Disp: , Rfl:    Physical Exam:   BP (!) 141/90 Comment: first attempt 141/90 second attempt 146/103  Pulse 88   SpO2 97%   Pertinent Findings   CN II-12 intact Facial sensation intact  Pupils equal round and reactive to light, no afferent pupillary defect; vision acuity normal for patient, no double vision  No chemosis or conjunctival  hemorrhage  No widened intercanthal  distance  No enophthalmus, proptosis or dystopia  No periocular ecchymosis  Extraocular movement are intact and pain free No orbital emphysema, eyelids are soft with no decreased retropulsion Palpation of the face reveals no tenderness other than that of the nasal bridge  Obvious deformity of the nasal bridge and septum with left-sided deviation, no septal hematoma, no epistaxis    Seprately Identifiable Procedures:  None  Impression & Plans:  Debbie Coleman is a 28 y.o. female with the following   Nasal bone fracture-  28 year old female 8 days status post nasal trauma.  She has obvious cosmetic deformity with left-sided deviation of the nasal bones and septum, she also has obstruction on the left side.  I would like a stat CT maxillofacial to assure no other injuries and assess the displacement prior to any operative intervention.  Once this is available I will discuss results with the patient and plan for further management.  The patient verbalized understanding and agreement to today's plan.   - f/u phone call discussion with CT maxillofacial results   Thank you for allowing me the opportunity to care for your patient. Please do not hesitate to contact me should you have any other questions.  Sincerely, Chyrl Cohen PA-C [facility] Phone: [phone] Fax: [phone]  01/11/2024, 1:47 PM

## 2024-01-11 NOTE — Patient Instructions (Signed)
 I have ordered an imaging study for you to complete prior to your next visit. Please call [facility] Scheduling at [phone] to schedule your imaging if you have not received a call within 24 hours. If you are unable to complete your imaging study prior to your next scheduled visit please call our office to let us  know.

## 2024-01-12 ENCOUNTER — Other Ambulatory Visit: Payer: Self-pay

## 2024-01-12 ENCOUNTER — Telehealth (INDEPENDENT_AMBULATORY_CARE_PROVIDER_SITE_OTHER): Payer: Self-pay | Admitting: Physician Assistant

## 2024-01-12 ENCOUNTER — Encounter (HOSPITAL_COMMUNITY): Payer: Self-pay

## 2024-01-12 DIAGNOSIS — F330 Major depressive disorder, recurrent, mild: Secondary | ICD-10-CM | POA: Diagnosis not present

## 2024-01-12 DIAGNOSIS — F900 Attention-deficit hyperactivity disorder, predominantly inattentive type: Secondary | ICD-10-CM | POA: Diagnosis not present

## 2024-01-12 DIAGNOSIS — F411 Generalized anxiety disorder: Secondary | ICD-10-CM | POA: Diagnosis not present

## 2024-01-12 DIAGNOSIS — S022XXD Fracture of nasal bones, subsequent encounter for fracture with routine healing: Secondary | ICD-10-CM

## 2024-01-12 NOTE — Telephone Encounter (Signed)
 Patient did complete CT maxillofacial, I reviewed it, she does have septal deviation question new versus old fracture, acute displaced nasal bone fracture as well.  I spoke with Dr. Tobie, he reviewed the imaging, he agreed that patient would benefit from closed nasal reduction, closed septal reduction.  The patient will be placed on the operative schedule and completed as soon as the OR scheduling permits.

## 2024-01-12 NOTE — Progress Notes (Signed)
 SDW call  Patient was given pre-op instructions over the phone. Patient verbalized understanding of instructions provided.     PCP - denies Cardiologist -  Pulmonary:    PPM/ICD - denies Device Orders - na Rep Notified - na   Chest x-ray - na EKG -  na Stress Test - ECHO -  Cardiac Cath -   Sleep Study/sleep apnea/CPAP: denies  Non-diabetic   Blood Thinner Instructions: denies Aspirin Instructions:denies   ERAS Protcol - Clears until 1400   Anesthesia review: No   Patient denies shortness of breath, fever, cough and chest pain over the phone call  Your procedure is scheduled on Tuesday January 17, 2024  Report to [facility] Main Entrance A at  1430  A.M., then check in with the Admitting office.  Call this number if you have problems the morning of surgery:  [phone]   If you have any questions prior to your surgery date call [phone]: Open Monday-Friday 8am-4pm If you experience any cold or flu symptoms such as cough, fever, chills, shortness of breath, etc. between now and your scheduled surgery, please notify us  at the above number    Remember:  Do not eat after midnight the night before your surgery  You may drink clear liquids until  1400   the morning of your surgery.   Clear liquids allowed are: Water, Non-Citrus Juices (without pulp), Carbonated Beverages, Clear Tea, Black Coffee ONLY (NO MILK, CREAM OR POWDERED CREAMER of any kind), and Gatorade   Take these medicines if needed the morning of surgery with A SIP OF WATER:  Tylenol   As of today, STOP taking any Aspirin (unless otherwise instructed by your surgeon) Aleve, Naproxen, Ibuprofen , Motrin , Advil , Goody's, BC's, all herbal medications, fish oil, and all vitamins.

## 2024-01-17 NOTE — Progress Notes (Signed)
 SDW call; patient updated with new arrival date and time of 01/18/2024 at 1445.  ERASE with clears until 1415. No further questions at this time.

## 2024-01-18 ENCOUNTER — Ambulatory Visit (HOSPITAL_COMMUNITY)
Admission: RE | Admit: 2024-01-18 | Discharge: 2024-01-18 | Disposition: A | Discharge status: Home / Self Care | Attending: Otolaryngology | Admitting: Otolaryngology

## 2024-01-18 ENCOUNTER — Encounter (HOSPITAL_COMMUNITY): Payer: Self-pay | Admitting: Internal Medicine

## 2024-01-18 ENCOUNTER — Encounter (HOSPITAL_COMMUNITY)
Admission: RE | Disposition: A | Discharge status: Home / Self Care | Payer: Self-pay | Source: Home / Self Care | Attending: Otolaryngology

## 2024-01-18 ENCOUNTER — Ambulatory Visit (HOSPITAL_COMMUNITY): Admitting: Anesthesiology

## 2024-01-18 DIAGNOSIS — S022XXA Fracture of nasal bones, initial encounter for closed fracture: Secondary | ICD-10-CM | POA: Insufficient documentation

## 2024-01-18 DIAGNOSIS — F17200 Nicotine dependence, unspecified, uncomplicated: Secondary | ICD-10-CM | POA: Insufficient documentation

## 2024-01-18 DIAGNOSIS — X58XXXA Exposure to other specified factors, initial encounter: Secondary | ICD-10-CM | POA: Insufficient documentation

## 2024-01-18 HISTORY — PX: CLOSED REDUCTION NASAL FRACTURE: SHX5365

## 2024-01-18 LAB — CBC
HCT: 42.4 % (ref 36.0–46.0)
Hemoglobin: 14.5 g/dL (ref 12.0–15.0)
MCH: 31 pg (ref 26.0–34.0)
MCHC: 34.2 g/dL (ref 30.0–36.0)
MCV: 90.8 fL (ref 80.0–100.0)
Platelets: 312 K/uL (ref 150–400)
RBC: 4.67 MIL/uL (ref 3.87–5.11)
RDW: 12.9 % (ref 11.5–15.5)
WBC: 6.7 K/uL (ref 4.0–10.5)
nRBC: 0 % (ref 0.0–0.2)

## 2024-01-18 SURGERY — CLOSED REDUCTION, FRACTURE, NASAL BONE
Anesthesia: General

## 2024-01-18 MED ORDER — 0.9 % SODIUM CHLORIDE (POUR BTL) OPTIME
TOPICAL | Status: DC | PRN
  Administered 2024-01-18: 1000 mL

## 2024-01-18 MED ORDER — MIDAZOLAM HCL 2 MG/2ML IJ SOLN
INTRAMUSCULAR | Status: AC
  Filled 2024-01-18: qty 2

## 2024-01-18 MED ORDER — LIDOCAINE HCL (CARDIAC) PF 100 MG/5ML IV SOSY
PREFILLED_SYRINGE | INTRAVENOUS | Status: DC | PRN
  Administered 2024-01-18: 40 mg via INTRAVENOUS

## 2024-01-18 MED ORDER — MIDAZOLAM HCL 5 MG/5ML IJ SOLN
INTRAMUSCULAR | Status: DC | PRN
  Administered 2024-01-18: 2 mg via INTRAVENOUS

## 2024-01-18 MED ORDER — CHLORHEXIDINE GLUCONATE 0.12 % MT SOLN
15.0000 mL | Freq: Once | OROMUCOSAL | Status: DC
  Filled 2024-01-18: qty 15

## 2024-01-18 MED ORDER — ACETAMINOPHEN 500 MG PO TABS
1000.0000 mg | ORAL_TABLET | Freq: Four times a day (QID) | ORAL | 2 refills | Status: AC | PRN
Start: 2024-01-18 — End: 2025-01-17

## 2024-01-18 MED ORDER — OXYCODONE HCL 5 MG PO TABS: 5.0000 mg | ORAL_TABLET | Freq: Once | ORAL | Status: DC | PRN

## 2024-01-18 MED ORDER — PROPOFOL 10 MG/ML IV BOLUS
INTRAVENOUS | Status: DC | PRN
  Administered 2024-01-18: 160 mg via INTRAVENOUS

## 2024-01-18 MED ORDER — OXYMETAZOLINE HCL 0.05 % NA SOLN
NASAL | Status: DC | PRN
Start: 2024-01-18 — End: 2024-01-18
  Administered 2024-01-18: 1 via TOPICAL

## 2024-01-18 MED ORDER — FENTANYL CITRATE (PF) 100 MCG/2ML IJ SOLN
INTRAMUSCULAR | Status: DC | PRN
  Administered 2024-01-18: 50 ug via INTRAVENOUS
  Administered 2024-01-18: 25 ug via INTRAVENOUS
  Administered 2024-01-18: 50 ug via INTRAVENOUS

## 2024-01-18 MED ORDER — ONDANSETRON HCL 4 MG/2ML IJ SOLN: 4.0000 mg | Freq: Four times a day (QID) | INTRAMUSCULAR | Status: DC | PRN

## 2024-01-18 MED ORDER — DEXAMETHASONE SODIUM PHOSPHATE 10 MG/ML IJ SOLN
INTRAMUSCULAR | Status: DC | PRN
  Administered 2024-01-18: 10 mg via INTRAVENOUS

## 2024-01-18 MED ORDER — FENTANYL CITRATE (PF) 100 MCG/2ML IJ SOLN: 25.0000 ug | INTRAMUSCULAR | Status: DC | PRN

## 2024-01-18 MED ORDER — OXYMETAZOLINE HCL 0.05 % NA SOLN
NASAL | Status: AC
Start: 2024-01-18 — End: 2024-01-18
  Filled 2024-01-18: qty 30

## 2024-01-18 MED ORDER — SALINE SPRAY 0.65 % NA SOLN: 2.0000 | NASAL | 2 refills | Status: AC | PRN

## 2024-01-18 MED ORDER — ONDANSETRON HCL 4 MG/2ML IJ SOLN
INTRAMUSCULAR | Status: DC | PRN
  Administered 2024-01-18: 4 mg via INTRAVENOUS

## 2024-01-18 MED ORDER — OXYCODONE HCL 5 MG PO TABS: 5.0000 mg | ORAL_TABLET | Freq: Three times a day (TID) | ORAL | 0 refills | Status: AC | PRN

## 2024-01-18 MED ORDER — ORAL CARE MOUTH RINSE: 15.0000 mL | Freq: Once | OROMUCOSAL | Status: DC

## 2024-01-18 MED ORDER — FENTANYL CITRATE (PF) 250 MCG/5ML IJ SOLN
INTRAMUSCULAR | Status: AC
  Filled 2024-01-18: qty 5

## 2024-01-18 MED ORDER — SUCCINYLCHOLINE CHLORIDE 200 MG/10ML IV SOSY
PREFILLED_SYRINGE | INTRAVENOUS | Status: DC | PRN
  Administered 2024-01-18: 120 mg via INTRAVENOUS

## 2024-01-18 MED ORDER — DEXMEDETOMIDINE HCL IN NACL 80 MCG/20ML IV SOLN
INTRAVENOUS | Status: DC | PRN
Start: 2024-01-18 — End: 2024-01-18
  Administered 2024-01-18: 8 ug via INTRAVENOUS

## 2024-01-18 MED ORDER — LACTATED RINGERS IV SOLN: INTRAVENOUS | Status: DC

## 2024-01-18 MED ORDER — LIDOCAINE 2% (20 MG/ML) 5 ML SYRINGE
INTRAMUSCULAR | Status: AC
  Filled 2024-01-18: qty 5

## 2024-01-18 MED ORDER — OXYCODONE HCL 5 MG/5ML PO SOLN: 5.0000 mg | Freq: Once | ORAL | Status: DC | PRN

## 2024-01-18 SURGICAL SUPPLY — 38 items
BAG COUNTER SPONGE SURGICOUNT (BAG) ×1 IMPLANT
BENZOIN TINCTURE PRP APPL 2/3 (GAUZE/BANDAGES/DRESSINGS) IMPLANT
BLADE INF TURB ROT M4 2 5PK (BLADE) ×1 IMPLANT
CANISTER SUCTION 3000ML PPV (SUCTIONS) ×1 IMPLANT
COAGULATOR SUCT 8FR VV (MISCELLANEOUS) ×1 IMPLANT
DRAPE HALF SHEET 40X57 (DRAPES) ×1 IMPLANT
DRSG TELFA 3X8 NADH STRL (GAUZE/BANDAGES/DRESSINGS) IMPLANT
ELECTRODE REM PT RTRN 9FT ADLT (ELECTROSURGICAL) ×1 IMPLANT
GLOVE BIO SURGEON STRL SZ 6.5 (GLOVE) ×1 IMPLANT
GLOVE BIOGEL PI IND STRL 7.0 (GLOVE) IMPLANT
GLOVE SURG SS PI 7.0 STRL IVOR (GLOVE) IMPLANT
GOWN STRL REUS W/ TWL LRG LVL3 (GOWN DISPOSABLE) ×2 IMPLANT
KIT BASIN OR (CUSTOM PROCEDURE TRAY) ×1 IMPLANT
KIT SPLINT NASAL DENVER PET BE (GAUZE/BANDAGES/DRESSINGS) IMPLANT
KIT TURNOVER KIT B (KITS) ×1 IMPLANT
NDL HYPO 25GX1X1/2 BEV (NEEDLE) IMPLANT
NDL HYPO 25X1 1.5 SAFETY (NEEDLE) ×1 IMPLANT
NDL PRECISIONGLIDE 27X1.5 (NEEDLE) ×2 IMPLANT
NEEDLE HYPO 25GX1X1/2 BEV (NEEDLE) IMPLANT
NEEDLE HYPO 25X1 1.5 SAFETY (NEEDLE) ×1 IMPLANT
NEEDLE PRECISIONGLIDE 27X1.5 (NEEDLE) ×2 IMPLANT
NS IRRIG 1000ML POUR BTL (IV SOLUTION) ×1 IMPLANT
PAD ARMBOARD POSITIONER FOAM (MISCELLANEOUS) ×1 IMPLANT
PATTIES SURGICAL .5 X3 (DISPOSABLE) IMPLANT
PATTIES SURGICAL .5X1.5 (GAUZE/BANDAGES/DRESSINGS) ×1 IMPLANT
POSITIONER HEAD DONUT 9IN (MISCELLANEOUS) IMPLANT
SOLUTION ANTFG W/FOAM PAD STRL (MISCELLANEOUS) ×1 IMPLANT
SPLINT NASAL DOYLE BI-VL (GAUZE/BANDAGES/DRESSINGS) ×1 IMPLANT
STRIP CLOSURE SKIN 1/2X4 (GAUZE/BANDAGES/DRESSINGS) IMPLANT
SUT CHROMIC 2 0 SH (SUTURE) IMPLANT
SUT CHROMIC 4 0 P 3 18 (SUTURE) ×1 IMPLANT
SUT CHROMIC 4 0 RB 1X27 (SUTURE) IMPLANT
SUT ETHILON 6 0 P 1 (SUTURE) IMPLANT
SUT PLAIN 4 0 ~~LOC~~ 1 (SUTURE) ×1 IMPLANT
SUT PLAIN GUT FAST 5-0 (SUTURE) ×1 IMPLANT
SUT PROLENE 3 0 SH 48 (SUTURE) ×1 IMPLANT
SYR CONTROL 10ML LL (SYRINGE) ×1 IMPLANT
TRAY ENT MC OR (CUSTOM PROCEDURE TRAY) ×1 IMPLANT

## 2024-01-18 NOTE — Anesthesia Procedure Notes (Signed)
 Procedure Name: Intubation Date/Time: 01/18/2024 8:23 PM  Performed by: Vidhi Delellis T, CRNAPre-anesthesia Checklist: Patient identified, Emergency Drugs available, Suction available and Patient being monitored Patient Re-evaluated:Patient Re-evaluated prior to induction Oxygen Delivery Method: Circle system utilized Preoxygenation: Pre-oxygenation with 100% oxygen Induction Type: IV induction, Rapid sequence and Cricoid Pressure applied Ventilation: Mask ventilation without difficulty Laryngoscope Size: Mac and 3 Grade View: Grade I Tube type: Oral Tube size: 7.0 mm Number of attempts: 1 Airway Equipment and Method: Stylet and Oral airway Placement Confirmation: ETT inserted through vocal cords under direct vision, positive ETCO2 and breath sounds checked- equal and bilateral Secured at: 22 cm Tube secured with: Tape Dental Injury: Teeth and Oropharynx as per pre-operative assessment

## 2024-01-18 NOTE — Anesthesia Preprocedure Evaluation (Addendum)
 Anesthesia Evaluation  Patient identified by MRN, date of birth, ID band Patient awake    Reviewed: Allergy & Precautions, H&P , NPO status , Patient's Chart, lab work & pertinent test results  Airway Mallampati: II  TM Distance: >3 FB Neck ROM: full    Dental  (+) Teeth Intact, Loose   Pulmonary Current Smoker   breath sounds clear to auscultation       Cardiovascular negative cardio ROS  Rhythm:Regular Rate:Normal     Neuro/Psych  Headaches PSYCHIATRIC DISORDERS Anxiety Depression       GI/Hepatic   Endo/Other    Renal/GU      Musculoskeletal   Abdominal   Peds  Hematology   Anesthesia Other Findings   Reproductive/Obstetrics                              Anesthesia Physical Anesthesia Plan  ASA: 2  Anesthesia Plan: General   Post-op Pain Management:    Induction: Intravenous  PONV Risk Score and Plan: 2 and Ondansetron , Dexamethasone , Midazolam  and Treatment may vary due to age or medical condition  Airway Management Planned: Oral ETT and LMA  Additional Equipment: None  Intra-op Plan:   Post-operative Plan: Extubation in OR  Informed Consent: I have reviewed the patients History and Physical, chart, labs and discussed the procedure including the risks, benefits and alternatives for the proposed anesthesia with the patient or authorized representative who has indicated his/her understanding and acceptance.     Dental advisory given  Plan Discussed with: CRNA, Anesthesiologist and Surgeon  Anesthesia Plan Comments:          Anesthesia Quick Evaluation

## 2024-01-18 NOTE — Transfer of Care (Signed)
 Immediate Anesthesia Transfer of Care Note  Patient: Debbie Coleman  Procedure(s) Performed: CLOSED REDUCTION, FRACTURE, NASAL BONE  Patient Location: PACU  Anesthesia Type:General  Level of Consciousness: awake, alert , and oriented  Airway & Oxygen Therapy: Patient Spontanous Breathing  Post-op Assessment: Report given to RN, Post -op Vital signs reviewed and stable, and Patient moving all extremities  Post vital signs: Reviewed and stable  Last Vitals:  Vitals Value Taken Time  BP 140/98 01/18/24 20:49  Temp 36.5 C 01/18/24 20:47  Pulse 80 01/18/24 20:52  Resp 12 01/18/24 20:52  SpO2 97 % 01/18/24 20:52  Vitals shown include unfiled device data.  Last Pain:  Vitals:   01/18/24 1538  TempSrc: Oral         Complications: No notable events documented.

## 2024-01-18 NOTE — Anesthesia Postprocedure Evaluation (Signed)
 Anesthesia Post Note  Patient: Debbie Coleman  Procedure(s) Performed: CLOSED REDUCTION, FRACTURE, NASAL BONE     Patient location during evaluation: PACU Anesthesia Type: General Level of consciousness: awake and alert Pain management: pain level controlled Vital Signs Assessment: post-procedure vital signs reviewed and stable Respiratory status: spontaneous breathing, nonlabored ventilation, respiratory function stable and patient connected to nasal cannula oxygen Cardiovascular status: blood pressure returned to baseline and stable Postop Assessment: no apparent nausea or vomiting Anesthetic complications: no   No notable events documented.  Last Vitals:  Vitals:   01/18/24 2100 01/18/24 2115  BP: (!) 150/120 (!) 136/92  Pulse: 73 63  Resp: 12 11  Temp:  36.7 C  SpO2: 100% 95%    Last Pain:  Vitals:   01/18/24 2115  TempSrc:   PainSc: 0-No pain                 [name]

## 2024-01-18 NOTE — Op Note (Signed)
 Otolaryngology Operative note  Sadhana Zaiya Annunziato Date/Time of Admission: 01/18/2024  3:16 PM  CSN: [id number];MRN:[id number]  DOB: 1996-03-17 Age: 28 y.o. Location: [facility] OR   Pre-Op Diagnosis: CLOSED FRACTURE OF NASAL BONE CLOSED FRACTURE OF NASAL SEPTUM   Post-Op Diagnosis: Same   Procedure: Procedure(s): CLOSED REDUCTION OF NASAL SEPTAL FRACTURE - CPT 21337 CLOSED REDUCTION OF NASAL BONE FRACTURE WITH EXTERNAL STABILIZATION - CPT 21320   Surgeon: Eldora Blanch, MD   Anesthesia type:  General  Anesthesiologist: CRNA: Eudora Pearla DASEN, CRNA   Staff: See log   Implants: Aquaplast splint nasal dorsum Small amount of nasopore placed bilaterally intranasally   Specimens: None   EBL: 10cc   Drains: None   Post-op disposition and condition: PACU, hemodynamically stable   Findings: Right nasal bone infracture with nasal pyramid shifted left to right. Septal fracture to the right. Reduced, with nasal dorsum more midline after procedure   Complications: None apparent   Indications and consent: Ms. Hardiman is a 28 yo female who suffered a nasal septal fracture and nasal bone fracture. The patient's options were discussed, including risks/benefits/alternatives for each option. Prior to surgery the possibility of revision surgery due to continued asymmetries or nasal obstruction were discussed. Patient expressed understanding, and despite these risks, consented and decided to proceed with above procedures. Informed consent was signed before proceeding.  OPERATIVE DETAILS:  The patient was identified in the holding room, brought to the operating room and placed supine on the table. General anesthesia was induced and a time out was performed. Patient was then prepped and draped in the usual fashion for the procedure.   Afrin soaked pledgets were placed in both nasal passages.   The nasal bone fracture was addressed. A Boies elevator was used externally to measure the distance to  the medial canthus.  It was then inserted in the right nasal cavity under the nasal bones. Manual pressure was used to elevate the nose to its proper location with the elevator.  This was repeated on the left until the dorsum appeared more midline.  The nasal bones were mobile.   Next, the septal fracture was reduced. The Asch forceps were used graps the nasal septum with the blades of the instrument and gently reduce the septum into more midline alignment. The nasal septum was then seen and noted to be more midline. Nasal septum was more midline.    Afrin soaked pledgets were then used to achieve hemostasis, which was adequate.    This concluded the procedure. A moist gauze was used to clean the nasal skin.  Benzoin was placed on the nasal skin and the nasal dorsum covered with steri strips  An Aqua-plast cast was cut to size and soaked in hot water.  When malleable it was applied to the steri-strips and held until it dried enough to maintain its shape.   The patient was then transferred to care of anesthesia and weaned from anesthetic and transported to PACU in stable condition.  Jaelynn Pozo B Maryum Batterson

## 2024-01-18 NOTE — Discharge Instructions (Addendum)
 Surgery Discharge Instructions (Dr. Tobie)  1. Call your doctor or go to the emergency room if you have: - Fever of 101.5 degrees or higher - Severe pain that has increased greatly since your surgery or is uncontrolled by your current pain medications - Increasing or concerning amount of bleeding from your nose. You should expect bloody drainage from your nose for 1-3 days and may have to change gauzes fair number of times day of and after surgery. Even a 5-10 minute trickle nose bleed is expected after surgery - Nausea and vomiting that does not go away - Chest pain/shortness of breath - Any other acute events, problems, or concerns  2. Wound Care/Dressings/Drain Instructions:  - It is OK to shower following your surgery but shower neck down. Keep splint dry. - You should begin using nasal saline spray (over the counter ocean spray - 2 sprays each nostril as neded) once you no longer have bleeding from the nose. This is usually 1-2 days after your surgery. - Do not blow your nose for 5 days after surgery - You have a cast on top of your nose. If it comes off, you can tape it back on.   3. Follow Up:  - A follow up appointment will be scheduled for you. If you do not know the date/time, please contact our office  4. Activity/Restrictions: - Resume your regular activities, as tolerated - Avoid heavy lifting, bending over, manipulating your nose, blowing your nose, sneezing with your mouth closed, straining and strenuous activities for 7 days. Do not lift more than 10 lbs for 10 days  5. Diet: - Resume your regular diet, as tolerated  6. Additional Instructions: - Use acetaminophen /Tylenol  and ibuprofen  to control your pain. If this does not bring you relief or the pain remains severe, a stronger pain medication (Oxycodone  - 5mg  tablet every 4-6 hours as needed) has been prescribed to you. - DO NOT MIX NARCOTIC PAIN MEDICATIONS OR TAKE NARCOTIC PRESCRIPTIONS AT THE SAME TIME (PERCODET,  LORTAB, ROXICODONE , ETC.) - DO NOT DRIVE OR OPERATE HEAVY MACHINERY WHILE ON NARCOTICS - DO NOT TAKE MORE THAN 4 GRAMS (4000mg ) TOTAL OF TYLENOL  (ACETAMINOPHEN ) IN 24 HOURS  ________________________________________________________________  Department of Otolaryngology Contact Info: Otolaryngology Nursing Triage (Monday-Friday daytime working hours or for emergencies after hours) [phone]

## 2024-01-18 NOTE — H&P (Addendum)
 Pre-Operative H&P - Day Of Surgery Patient Name: Debbie Coleman Date:   01/18/2024  HPI: Debbie Coleman is a 28 y.o. female who presents today for operative treatment of nasal bone fracture and nasal septal fracture. Patient denies recent significant changes to health or significant new medications or physiologic change in condition which would immediately impact plans. No new types of therapy has been initiated that would change the plan or the appropriateness of the plan.   ROS:  A complete review of systems was obtained and is otherwise negative.   PMH:  Past Medical History:  Diagnosis Date   ADHD (attention deficit hyperactivity disorder)    Chronic cystitis 03/05/2013   Chronic low back pain 03/02/2016   Depression    Depression with anxiety 09/28/2014   Family history of breast cancer 08/2017   qualifies for updated genetic testing, may wait till older   Foot fracture, left 01/31/2020   Migraines 01/31/2020    PSH:  Past Surgical History:  Procedure Laterality Date   FOOT SURGERY Left 09/2004   compound fracture of left foot-had open reduction and a second surgery to remove hardware.   LEEP N/A 12/06/2023   Procedure: LEEP (LOOP ELECTROSURGICAL EXCISION PROCEDURE) ENDOCERVICAL CURRETAGE;  Surgeon: Abigail Rollo DASEN, MD;  Location: [facility] OR;  Service: Gynecology;  Laterality: N/A;   WISDOM TOOTH EXTRACTION  age 21    MEDS:   Current Facility-Administered Medications:    chlorhexidine  (PERIDEX ) 0.12 % solution 15 mL, 15 mL, Mouth/Throat, Once **OR** Oral care mouth rinse, 15 mL, Mouth Rinse, Once, Hodierne, Adam, MD   lactated ringers  infusion, , Intravenous, Continuous, Hodierne, Adam, MD  ALLERGIES: Patient has no known allergies.  EXAM: Vitals: BP 115/84 (BP Location: Right Arm)   Pulse 74   Temp 98.1 F (36.7 C) (Oral)   Resp 18   Ht 5' 5 (1.651 m)   Wt 68 kg   LMP 11/16/2023 (Approximate)   SpO2 98%   BMI 24.96 kg/m   General Awake, at baseline alertness.    HEENT No scleral icterus or conjunctival hemorrhage. Globe position appears normal. External ears  normal. Nose patent without rhinorrhea. No lymphadenopathy. No thyromegaly  Cardiovascular No cyanosis.  Pulmonary No audible stridor. Breathing easily with no labor.  Neuro Symmetric facial movement.   Psychiatry Appropriate affect and mood.  Skin No scars or lesions on face or neck.  Extermities Moves all extremities with normal range of motion.   Other Findings None.   Assessment & Plan: Debbie Coleman has diagnoses of nasal bone and nasal septal fracture and will go to the OR today for closed nasal reduction and closed septal reduction. Informed consent was obtained and available in EMR today. All questions have been answered, and risks/benefits/alternatives of procedure as noted in the consent were discussed in a quiet area. Questions were invited and answered. The patient expressed understanding, provided consent and wished to proceed despite risks.  Best phone: [phone]  Eldora KATHEE Blanch 01/18/2024 5:20 PM

## 2024-01-19 ENCOUNTER — Encounter (HOSPITAL_COMMUNITY): Payer: Self-pay | Admitting: Otolaryngology

## 2024-01-24 ENCOUNTER — Ambulatory Visit (INDEPENDENT_AMBULATORY_CARE_PROVIDER_SITE_OTHER): Admitting: Physician Assistant

## 2024-01-24 VITALS — BP 114/78 | HR 98

## 2024-01-24 DIAGNOSIS — Z9889 Other specified postprocedural states: Secondary | ICD-10-CM

## 2024-01-24 DIAGNOSIS — S022XXD Fracture of nasal bones, subsequent encounter for fracture with routine healing: Secondary | ICD-10-CM

## 2024-01-24 DIAGNOSIS — H608X3 Other otitis externa, bilateral: Secondary | ICD-10-CM

## 2024-01-24 MED ORDER — TRIAMCINOLONE ACETONIDE 0.5 % EX CREA
TOPICAL_CREAM | Freq: Two times a day (BID) | CUTANEOUS | 0 refills | Status: AC
Start: 2024-01-24 — End: ?

## 2024-01-24 MED ORDER — CIPROFLOXACIN-DEXAMETHASONE 0.3-0.1 % OT SUSP: 4.0000 [drp] | Freq: Two times a day (BID) | OTIC | 0 refills | Status: AC

## 2024-01-24 NOTE — Progress Notes (Signed)
 Dear Dr. Rexford ref. provider found, Here is my assessment for our mutual patient, Debbie Coleman. Thank you for allowing me the opportunity to care for your patient. Please do not hesitate to contact me should you have any other questions. Sincerely, Chyrl Cohen PA-C  Otolaryngology Clinic Note Referring provider: Dr. Rexford ref. provider found HPI:  Debbie Coleman is a 28 y.o. female kindly referred by Dr. No ref. provider found   The patient is a 28 year old female seen in our office for follow-up evaluation status post closed nasal bone reduction, closed septal reduction by Dr. Tobie on 01/18/2024.  Postoperatively patient notes she is doing well, no significant issues, she notes that she has had significant improvement in her nasal obstructive symptoms.  She notes that there is still some asymmetry in the nasal bones.  No changes to smell, no epistaxis.  Additionally the patient notes that she has had ongoing itchy ears, she denies any infectious signs or symptoms.  She notes this has been several years of symptoms.  She notes she uses Q-tips.   Independent Review of Additional Tests or Records:  None   PMH/Meds/All/SocHx/FamHx/ROS:   Past Medical History:  Diagnosis Date   ADHD (attention deficit hyperactivity disorder)    Chronic cystitis 03/05/2013   Chronic low back pain 03/02/2016   Depression    Depression with anxiety 09/28/2014   Family history of breast cancer 08/2017   qualifies for updated genetic testing, may wait till older   Foot fracture, left 01/31/2020   Migraines 01/31/2020     Past Surgical History:  Procedure Laterality Date   CLOSED REDUCTION NASAL FRACTURE N/A 01/18/2024   Procedure: CLOSED REDUCTION, FRACTURE, NASAL BONE;  Surgeon: Tobie Eldora NOVAK, MD;  Location: [facility] OR;  Service: ENT;  Laterality: N/A;   FOOT SURGERY Left 09/2004   compound fracture of left foot-had open reduction and a second surgery to remove hardware.   LEEP N/A 12/06/2023   Procedure: LEEP  (LOOP ELECTROSURGICAL EXCISION PROCEDURE) ENDOCERVICAL CURRETAGE;  Surgeon: Abigail Rollo DASEN, MD;  Location: [facility] OR;  Service: Gynecology;  Laterality: N/A;   WISDOM TOOTH EXTRACTION  age 10    Family History  Problem Relation Age of Onset   Anxiety disorder Mother    Healthy Father    Other Maternal Grandmother        acquired absence of kidney   Breast cancer Maternal Grandmother 76       BRCA negative not on tamoxifen after unknown estrogen   Hypertension Maternal Grandmother    Healthy Maternal Grandfather    Kidney failure Other    Healthy Half-Sister    Healthy Half-Sister    Healthy Half-Brother    Healthy Half-Brother    Healthy Half-Brother    Healthy Half-Brother      Social Connections: Moderately Isolated (06/03/2022)   Social Connection and Isolation Panel    Frequency of Communication with Friends and Family: More than three times a week    Frequency of Social Gatherings with Friends and Family: Twice a week    Attends Religious Services: Never    Database administrator or Organizations: No    Attends Engineer, structural: Never    Marital Status: Living with partner      Current Outpatient Medications:    acetaminophen  (TYLENOL ) 500 MG tablet, Take 2 tablets (1,000 mg total) by mouth every 6 (six) hours as needed., Disp: 100 tablet, Rfl: 2   ibuprofen  (ADVIL ) 600 MG tablet, Take 1 tablet (600  mg total) by mouth every 6 (six) hours as needed., Disp: 30 tablet, Rfl: 0   medroxyPROGESTERone  (DEPO-PROVERA ) 150 MG/ML injection, Inject 1 mL (150 mg total) into the muscle every 3 (three) months., Disp: 1 mL, Rfl: 2   oxyCODONE  (ROXICODONE ) 5 MG immediate release tablet, Take 1 tablet (5 mg total) by mouth every 8 (eight) hours as needed., Disp: 20 tablet, Rfl: 0   sodium chloride  (OCEAN) 0.65 % SOLN nasal spray, Place 2 sprays into both nostrils as needed for up to 14 days for congestion., Disp: 30 mL, Rfl: 2   VYVANSE 50 MG capsule, Take 50 mg by mouth every  morning., Disp: , Rfl:    VYVANSE 40 MG capsule, Take 40 mg by mouth every morning., Disp: , Rfl:    Physical Exam:   BP 114/78   Pulse 98   LMP 11/16/2023 (Approximate)   SpO2 98%   Pertinent Findings  CN II-XII intact Bilateral external auditory canals clear, TM intact with well-pneumatized middle ear space Slight left asymmetry of the nasal bridge Anterior rhinoscopy: Septum midline, no septal hematoma No obviously palpable neck masses/lymphadenopathy/thyromegaly No respiratory distress or stridor  Seprately Identifiable Procedures:  None  Impression & Plans:  Debbie Coleman is a 28 y.o. female with the following   Postop nasal bone reduction, septal reduction-  Overall the patient is doing well, improved cosmesis compared to preoperative state although some minimal asymmetry that has persisted.  She has had significant improvement in her nasal obstructive symptoms.  I recommend a continued conservative approach, allow the swelling to resolve.  If she has persistent concerns I would like her to reach back out to the office for further evaluation and management.  She was given strict return precautions.  She verbalized understanding and agreement to today's plan.  Eczematoid otitis externa  Eczematoid otitis externa, no signs of infection today.  Patient will likely improve with Ciprodex  drops, external steroid cream.  I am happy to see her back in the office for any further management of this.   - f/u PRN   Thank you for allowing me the opportunity to care for your patient. Please do not hesitate to contact me should you have any other questions.  Sincerely, Chyrl Cohen PA-C  [facility] Phone: [phone] Fax: [phone]  01/24/2024, 11:06 AM

## 2024-02-13 ENCOUNTER — Ambulatory Visit

## 2024-02-15 ENCOUNTER — Ambulatory Visit

## 2024-03-12 NOTE — Addendum Note (Signed)
 Encounter addended by: Irven Gauze E, [place] on: 03/12/2024 9:30 PM  Actions taken: Imaging Exam ended

## 2024-04-06 DIAGNOSIS — F411 Generalized anxiety disorder: Secondary | ICD-10-CM | POA: Diagnosis not present

## 2024-04-06 DIAGNOSIS — F330 Major depressive disorder, recurrent, mild: Secondary | ICD-10-CM | POA: Diagnosis not present

## 2024-04-06 DIAGNOSIS — F900 Attention-deficit hyperactivity disorder, predominantly inattentive type: Secondary | ICD-10-CM | POA: Diagnosis not present
